# Patient Record
Sex: Female | Born: 1935 | Race: White | Hispanic: No | Marital: Married
Health system: Southern US, Community
[De-identification: ages and names within clinical notes are randomized; demographics above are authoritative.]

## PROBLEM LIST (undated history)

## (undated) DIAGNOSIS — E039 Hypothyroidism, unspecified: Secondary | ICD-10-CM

## (undated) DIAGNOSIS — I10 Essential (primary) hypertension: Secondary | ICD-10-CM

## (undated) DIAGNOSIS — I4891 Unspecified atrial fibrillation: Secondary | ICD-10-CM

---

## 2011-05-31 ENCOUNTER — Emergency Department: Payer: Self-pay | Admitting: Emergency Medicine

## 2011-07-16 ENCOUNTER — Emergency Department: Payer: Self-pay | Admitting: Emergency Medicine

## 2011-07-18 ENCOUNTER — Emergency Department: Payer: Self-pay | Admitting: Emergency Medicine

## 2011-07-19 ENCOUNTER — Emergency Department: Payer: Self-pay | Admitting: Unknown Physician Specialty

## 2011-07-27 ENCOUNTER — Ambulatory Visit: Payer: Self-pay | Admitting: Urology

## 2011-07-29 ENCOUNTER — Ambulatory Visit: Payer: Self-pay | Admitting: Urology

## 2011-08-12 ENCOUNTER — Ambulatory Visit: Payer: Self-pay | Admitting: Urology

## 2011-08-17 ENCOUNTER — Ambulatory Visit: Payer: Self-pay | Admitting: Urology

## 2011-09-16 ENCOUNTER — Ambulatory Visit: Payer: Self-pay | Admitting: Urology

## 2011-10-05 ENCOUNTER — Ambulatory Visit: Payer: Self-pay | Admitting: Obstetrics and Gynecology

## 2011-10-08 ENCOUNTER — Ambulatory Visit: Payer: Self-pay | Admitting: Obstetrics and Gynecology

## 2011-12-29 ENCOUNTER — Ambulatory Visit: Payer: Self-pay | Admitting: Urology

## 2012-06-28 ENCOUNTER — Ambulatory Visit: Payer: Self-pay | Admitting: Urology

## 2012-08-23 IMAGING — CR DG ABDOMEN 1V
1 series · 2 of 2 positions shown · non-contrast
Comparison: none

REASON FOR EXAM: Nephrolithiasis and renal colic
COMMENTS:

PROCEDURE:     DXR - DXR KIDNEY URETER BLADDER  - June 28, 2012 [DATE]
RESULT:     Comparisons:  12/29/2011

[Series 1: supine kub · 0.17mm/px · 2 of 2 slices shown]
[im 1/2]
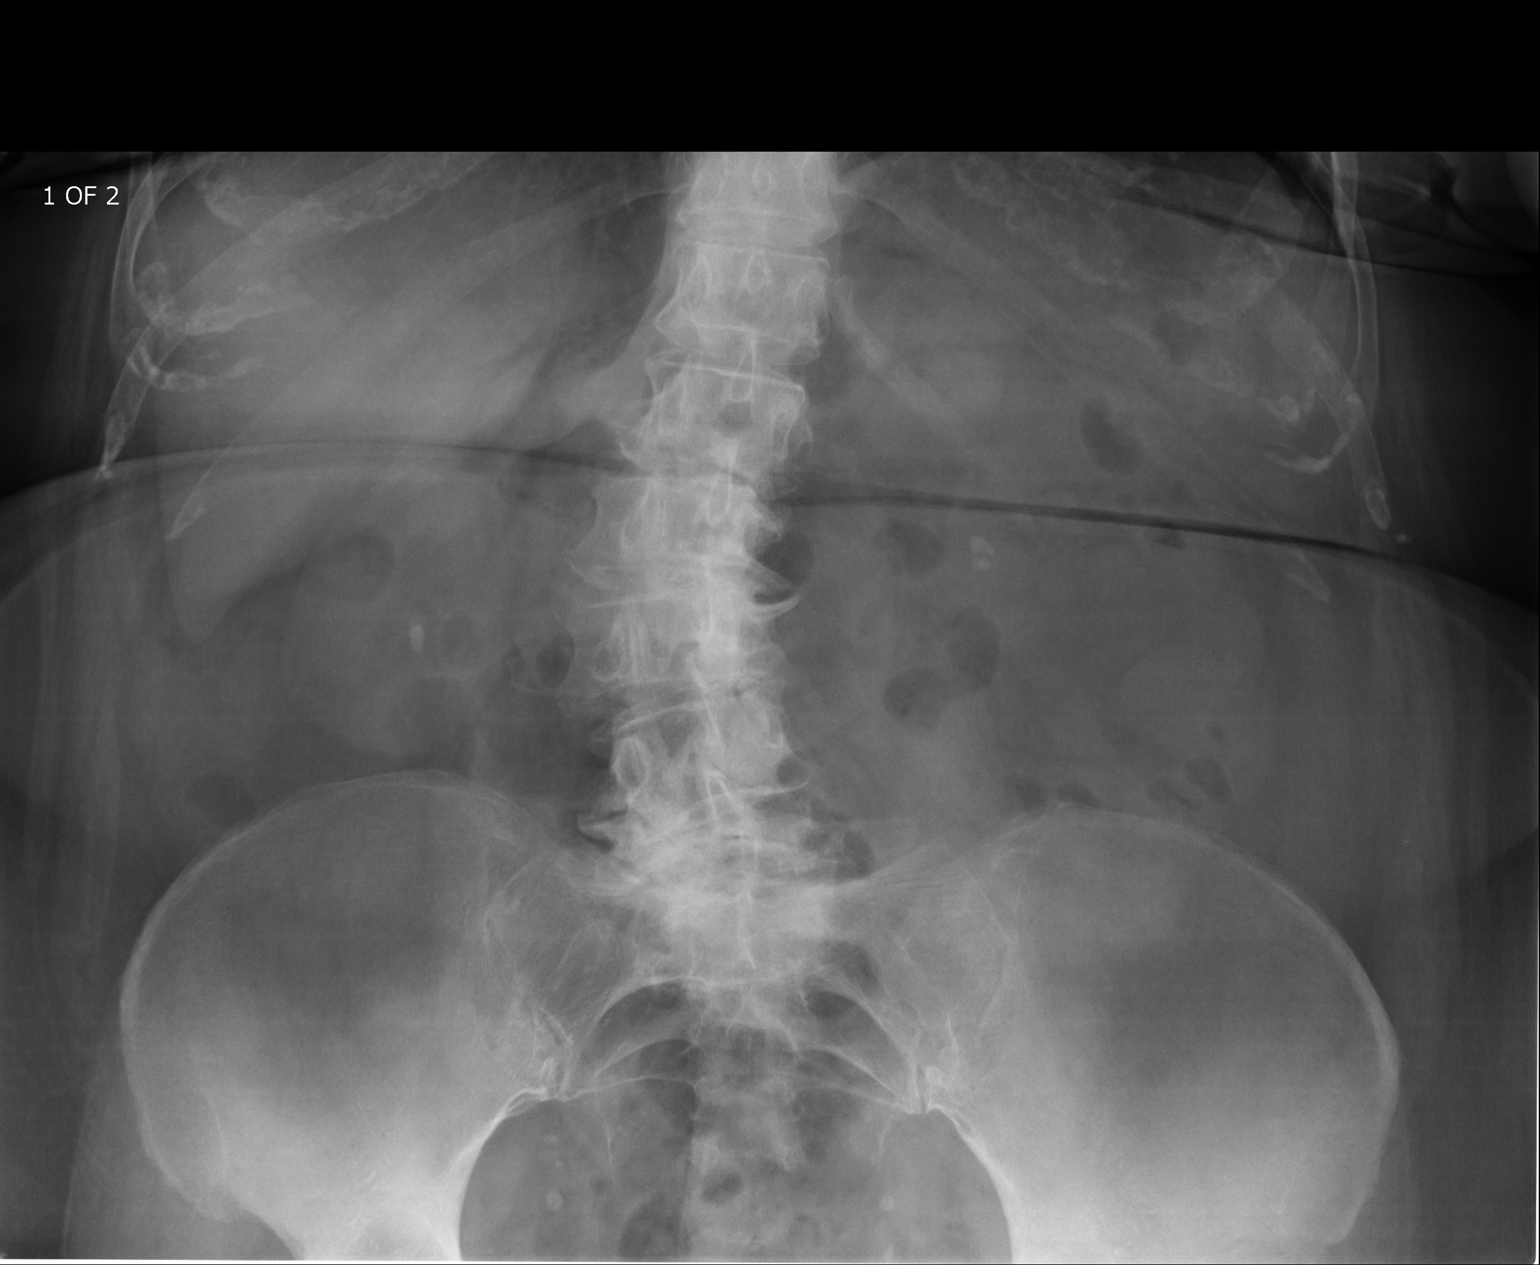
[im 2/2]
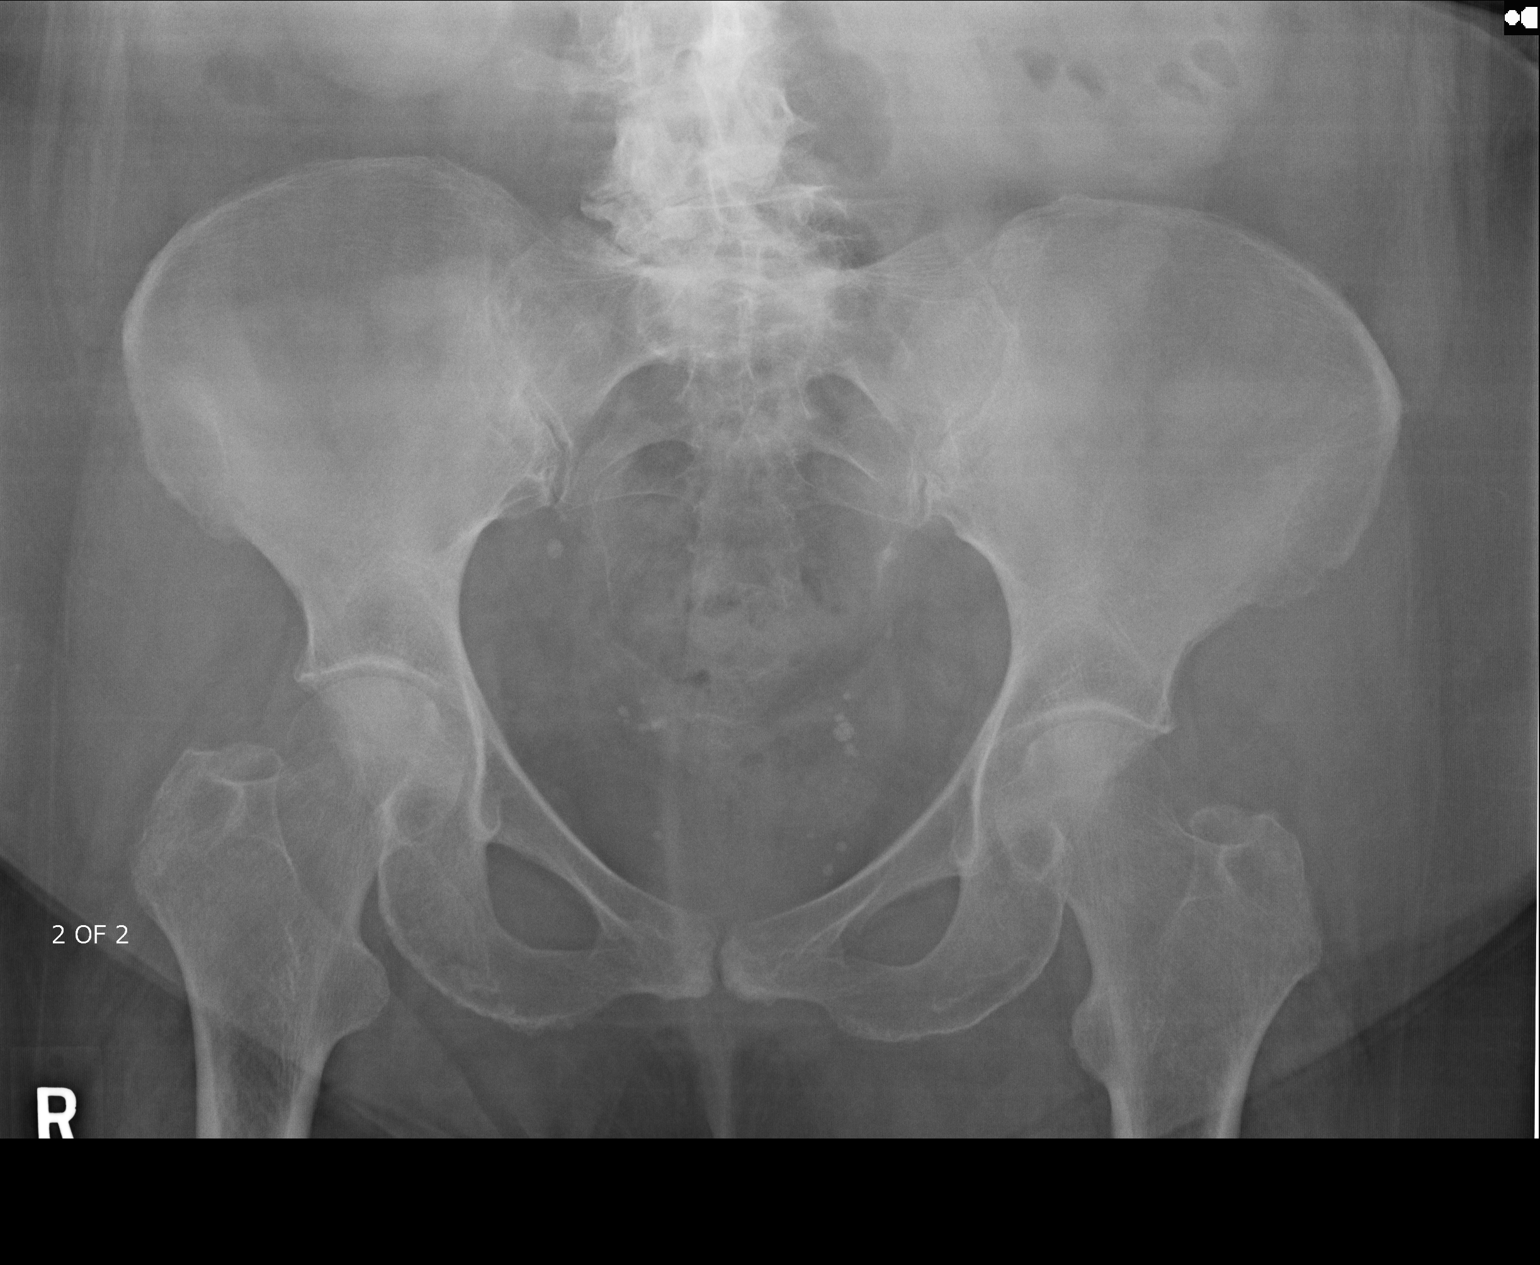

[2 of 2 positions shown; findings below may reference images not displayed]

FINDINGS: Supine radiograph of the abdomen is provided.

There is a nonspecific bowel gas pattern. There is no bowel dilatation to
suggest obstruction. There bilateral nephrolithiasis. There is no pathologic
calcification along the expected course of the ureters. There are numerous
phleboliths in the pelvis. There is no evidence of pneumoperitoneum, portal
venous gas, or pneumatosis.

The osseous structures are unremarkable.
IMPRESSION: Bilateral nephrolithiasis.

[REDACTED]

## 2013-07-25 ENCOUNTER — Emergency Department: Payer: Self-pay | Admitting: Emergency Medicine

## 2013-07-25 LAB — BASIC METABOLIC PANEL
Anion Gap: 2 — ABNORMAL LOW (ref 7–16)
BUN: 29 mg/dL — ABNORMAL HIGH (ref 7–18)
Calcium, Total: 10 mg/dL (ref 8.5–10.1)
Chloride: 106 mmol/L (ref 98–107)
Co2: 31 mmol/L (ref 21–32)
EGFR (African American): 45 — ABNORMAL LOW
EGFR (Non-African Amer.): 38 — ABNORMAL LOW
Glucose: 108 mg/dL — ABNORMAL HIGH (ref 65–99)
Osmolality: 284 (ref 275–301)
Potassium: 4.3 mmol/L (ref 3.5–5.1)

## 2013-07-25 LAB — CBC
HGB: 13.3 g/dL (ref 12.0–16.0)
MCH: 30 pg (ref 26.0–34.0)
MCV: 87 fL (ref 80–100)
RBC: 4.43 10*6/uL (ref 3.80–5.20)
RDW: 13.8 % (ref 11.5–14.5)
WBC: 8.7 10*3/uL (ref 3.6–11.0)

## 2013-07-26 LAB — URINALYSIS, COMPLETE
Bilirubin,UR: NEGATIVE
Glucose,UR: NEGATIVE mg/dL (ref 0–75)
Protein: 30
RBC,UR: 129 /HPF (ref 0–5)
Squamous Epithelial: 2

## 2015-04-19 ENCOUNTER — Emergency Department (HOSPITAL_COMMUNITY): Payer: Medicare Other

## 2015-04-19 ENCOUNTER — Encounter (HOSPITAL_COMMUNITY): Payer: Self-pay | Admitting: Emergency Medicine

## 2015-04-19 ENCOUNTER — Inpatient Hospital Stay (HOSPITAL_COMMUNITY)
Admission: EM | Admit: 2015-04-19 | Discharge: 2015-05-21 | DRG: 064 | Disposition: E | Payer: Medicare Other | Attending: Neurology | Admitting: Neurology

## 2015-04-19 ENCOUNTER — Inpatient Hospital Stay (HOSPITAL_COMMUNITY): Payer: Medicare Other

## 2015-04-19 DIAGNOSIS — E785 Hyperlipidemia, unspecified: Secondary | ICD-10-CM | POA: Diagnosis present

## 2015-04-19 DIAGNOSIS — I63412 Cerebral infarction due to embolism of left middle cerebral artery: Secondary | ICD-10-CM | POA: Diagnosis present

## 2015-04-19 DIAGNOSIS — I639 Cerebral infarction, unspecified: Secondary | ICD-10-CM | POA: Diagnosis not present

## 2015-04-19 DIAGNOSIS — E039 Hypothyroidism, unspecified: Secondary | ICD-10-CM | POA: Diagnosis present

## 2015-04-19 DIAGNOSIS — Z6836 Body mass index (BMI) 36.0-36.9, adult: Secondary | ICD-10-CM | POA: Diagnosis not present

## 2015-04-19 DIAGNOSIS — J690 Pneumonitis due to inhalation of food and vomit: Secondary | ICD-10-CM | POA: Diagnosis present

## 2015-04-19 DIAGNOSIS — R1314 Dysphagia, pharyngoesophageal phase: Secondary | ICD-10-CM | POA: Diagnosis present

## 2015-04-19 DIAGNOSIS — Z66 Do not resuscitate: Secondary | ICD-10-CM | POA: Diagnosis present

## 2015-04-19 DIAGNOSIS — R29810 Facial weakness: Secondary | ICD-10-CM | POA: Diagnosis present

## 2015-04-19 DIAGNOSIS — I482 Chronic atrial fibrillation, unspecified: Secondary | ICD-10-CM | POA: Diagnosis present

## 2015-04-19 DIAGNOSIS — R4182 Altered mental status, unspecified: Secondary | ICD-10-CM | POA: Diagnosis present

## 2015-04-19 DIAGNOSIS — R0600 Dyspnea, unspecified: Secondary | ICD-10-CM | POA: Insufficient documentation

## 2015-04-19 DIAGNOSIS — Z452 Encounter for adjustment and management of vascular access device: Secondary | ICD-10-CM

## 2015-04-19 DIAGNOSIS — I10 Essential (primary) hypertension: Secondary | ICD-10-CM | POA: Diagnosis present

## 2015-04-19 DIAGNOSIS — R2990 Unspecified symptoms and signs involving the nervous system: Secondary | ICD-10-CM

## 2015-04-19 DIAGNOSIS — E669 Obesity, unspecified: Secondary | ICD-10-CM | POA: Diagnosis present

## 2015-04-19 DIAGNOSIS — R4701 Aphasia: Secondary | ICD-10-CM | POA: Diagnosis present

## 2015-04-19 DIAGNOSIS — G8191 Hemiplegia, unspecified affecting right dominant side: Secondary | ICD-10-CM | POA: Diagnosis present

## 2015-04-19 DIAGNOSIS — G936 Cerebral edema: Secondary | ICD-10-CM | POA: Diagnosis present

## 2015-04-19 DIAGNOSIS — R4020 Unspecified coma: Secondary | ICD-10-CM | POA: Diagnosis present

## 2015-04-19 DIAGNOSIS — K117 Disturbances of salivary secretion: Secondary | ICD-10-CM | POA: Insufficient documentation

## 2015-04-19 DIAGNOSIS — Z7982 Long term (current) use of aspirin: Secondary | ICD-10-CM | POA: Diagnosis not present

## 2015-04-19 DIAGNOSIS — G935 Compression of brain: Secondary | ICD-10-CM | POA: Diagnosis present

## 2015-04-19 DIAGNOSIS — I63432 Cerebral infarction due to embolism of left posterior cerebral artery: Secondary | ICD-10-CM | POA: Diagnosis present

## 2015-04-19 DIAGNOSIS — Z515 Encounter for palliative care: Secondary | ICD-10-CM | POA: Diagnosis not present

## 2015-04-19 DIAGNOSIS — E038 Other specified hypothyroidism: Secondary | ICD-10-CM

## 2015-04-19 DIAGNOSIS — R609 Edema, unspecified: Secondary | ICD-10-CM

## 2015-04-19 HISTORY — DX: Unspecified atrial fibrillation: I48.91

## 2015-04-19 HISTORY — DX: Essential (primary) hypertension: I10

## 2015-04-19 HISTORY — DX: Hypothyroidism, unspecified: E03.9

## 2015-04-19 LAB — DIFFERENTIAL
BASOS ABS: 0 10*3/uL (ref 0.0–0.1)
BASOS PCT: 0 % (ref 0–1)
Eosinophils Absolute: 0 10*3/uL (ref 0.0–0.7)
Eosinophils Relative: 0 % (ref 0–5)
Lymphocytes Relative: 7 % — ABNORMAL LOW (ref 12–46)
Lymphs Abs: 0.9 10*3/uL (ref 0.7–4.0)
MONO ABS: 0.8 10*3/uL (ref 0.1–1.0)
MONOS PCT: 6 % (ref 3–12)
Neutro Abs: 11.5 10*3/uL — ABNORMAL HIGH (ref 1.7–7.7)
Neutrophils Relative %: 87 % — ABNORMAL HIGH (ref 43–77)

## 2015-04-19 LAB — I-STAT CHEM 8, ED
BUN: 36 mg/dL — ABNORMAL HIGH (ref 6–23)
CALCIUM ION: 1.2 mmol/L (ref 1.13–1.30)
Chloride: 102 mmol/L (ref 96–112)
Creatinine, Ser: 1.2 mg/dL — ABNORMAL HIGH (ref 0.50–1.10)
GLUCOSE: 153 mg/dL — AB (ref 70–99)
HEMATOCRIT: 46 % (ref 36.0–46.0)
HEMOGLOBIN: 15.6 g/dL — AB (ref 12.0–15.0)
POTASSIUM: 4.3 mmol/L (ref 3.5–5.1)
Sodium: 136 mmol/L (ref 135–145)
TCO2: 22 mmol/L (ref 0–100)

## 2015-04-19 LAB — COMPREHENSIVE METABOLIC PANEL
ALK PHOS: 74 U/L (ref 39–117)
ALT: 34 U/L (ref 0–35)
AST: 32 U/L (ref 0–37)
Albumin: 3.8 g/dL (ref 3.5–5.2)
Anion gap: 14 (ref 5–15)
BUN: 28 mg/dL — ABNORMAL HIGH (ref 6–23)
CO2: 21 mmol/L (ref 19–32)
Calcium: 9.6 mg/dL (ref 8.4–10.5)
Chloride: 100 mmol/L (ref 96–112)
Creatinine, Ser: 1.36 mg/dL — ABNORMAL HIGH (ref 0.50–1.10)
GFR calc Af Amer: 42 mL/min — ABNORMAL LOW (ref >90)
GFR calc non Af Amer: 36 mL/min — ABNORMAL LOW (ref >90)
Glucose, Bld: 152 mg/dL — ABNORMAL HIGH (ref 70–99)
Potassium: 4.1 mmol/L (ref 3.5–5.1)
SODIUM: 135 mmol/L (ref 135–145)
TOTAL PROTEIN: 7 g/dL (ref 6.0–8.3)
Total Bilirubin: 0.8 mg/dL (ref 0.3–1.2)

## 2015-04-19 LAB — MRSA PCR SCREENING: MRSA BY PCR: NEGATIVE

## 2015-04-19 LAB — RAPID URINE DRUG SCREEN, HOSP PERFORMED
AMPHETAMINES: NOT DETECTED
BENZODIAZEPINES: POSITIVE — AB
Barbiturates: NOT DETECTED
COCAINE: NOT DETECTED
OPIATES: NOT DETECTED
Tetrahydrocannabinol: NOT DETECTED

## 2015-04-19 LAB — I-STAT TROPONIN, ED: Troponin i, poc: 0.15 ng/mL (ref 0.00–0.08)

## 2015-04-19 LAB — URINALYSIS, ROUTINE W REFLEX MICROSCOPIC
Bilirubin Urine: NEGATIVE
Glucose, UA: NEGATIVE mg/dL
Hgb urine dipstick: NEGATIVE
KETONES UR: NEGATIVE mg/dL
Leukocytes, UA: NEGATIVE
Nitrite: NEGATIVE
PH: 5 (ref 5.0–8.0)
Protein, ur: NEGATIVE mg/dL
SPECIFIC GRAVITY, URINE: 1.02 (ref 1.005–1.030)
UROBILINOGEN UA: 0.2 mg/dL (ref 0.0–1.0)

## 2015-04-19 LAB — PROTIME-INR
INR: 1.05 (ref 0.00–1.49)
PROTHROMBIN TIME: 13.8 s (ref 11.6–15.2)

## 2015-04-19 LAB — CBC
HCT: 43.3 % (ref 36.0–46.0)
HEMOGLOBIN: 14.2 g/dL (ref 12.0–15.0)
MCH: 28.9 pg (ref 26.0–34.0)
MCHC: 32.8 g/dL (ref 30.0–36.0)
MCV: 88.2 fL (ref 78.0–100.0)
Platelets: 204 10*3/uL (ref 150–400)
RBC: 4.91 MIL/uL (ref 3.87–5.11)
RDW: 13.1 % (ref 11.5–15.5)
WBC: 13.2 10*3/uL — AB (ref 4.0–10.5)

## 2015-04-19 LAB — APTT: aPTT: 22 seconds — ABNORMAL LOW (ref 24–37)

## 2015-04-19 LAB — ETHANOL: Alcohol, Ethyl (B): <5 mg/dL (ref 0–9)

## 2015-04-19 MED ORDER — ASPIRIN 300 MG RE SUPP
300.0000 mg | Freq: Every day | RECTAL | Status: DC
  Administered 2015-04-19 – 2015-04-20 (×2): 300 mg via RECTAL
  Filled 2015-04-19 (×3): qty 1

## 2015-04-19 MED ORDER — ONDANSETRON HCL 4 MG/2ML IJ SOLN
4.0000 mg | Freq: Once | INTRAMUSCULAR | Status: AC
  Administered 2015-04-19: 4 mg via INTRAVENOUS
  Filled 2015-04-19: qty 2

## 2015-04-19 MED ORDER — LEVOTHYROXINE SODIUM 100 MCG IV SOLR
50.0000 ug | Freq: Every day | INTRAVENOUS | Status: DC
  Administered 2015-04-19 – 2015-04-20 (×2): 50 ug via INTRAVENOUS
  Filled 2015-04-19 (×3): qty 5

## 2015-04-19 MED ORDER — ONDANSETRON HCL 4 MG PO TABS: 4.0000 mg | ORAL_TABLET | Freq: Four times a day (QID) | ORAL | Status: DC | PRN

## 2015-04-19 MED ORDER — ACETAMINOPHEN 325 MG PO TABS: 650.0000 mg | ORAL_TABLET | Freq: Four times a day (QID) | ORAL | Status: DC | PRN

## 2015-04-19 MED ORDER — ONDANSETRON HCL 4 MG/2ML IJ SOLN: 4.0000 mg | Freq: Four times a day (QID) | INTRAMUSCULAR | Status: DC | PRN

## 2015-04-19 MED ORDER — SODIUM CHLORIDE 0.9 % IV SOLN: INTRAVENOUS | Status: AC

## 2015-04-19 MED ORDER — DEXTROSE 5 % IV SOLN
2.0000 g | INTRAVENOUS | Status: DC
  Administered 2015-04-19 – 2015-04-20 (×2): 2 g via INTRAVENOUS
  Filled 2015-04-19 (×4): qty 2

## 2015-04-19 MED ORDER — SODIUM CHLORIDE 0.9 % IV SOLN
INTRAVENOUS | Status: DC
  Administered 2015-04-19: 18:00:00 via INTRAVENOUS

## 2015-04-19 MED ORDER — MORPHINE SULFATE 2 MG/ML IJ SOLN
2.0000 mg | INTRAMUSCULAR | Status: DC | PRN
Start: 2015-04-19 — End: 2015-04-21
  Administered 2015-04-21: 2 mg via INTRAVENOUS
  Filled 2015-04-19: qty 1

## 2015-04-19 MED ORDER — HEPARIN SODIUM (PORCINE) 5000 UNIT/ML IJ SOLN
5000.0000 [IU] | Freq: Three times a day (TID) | INTRAMUSCULAR | Status: DC
  Administered 2015-04-19 – 2015-04-21 (×5): 5000 [IU] via SUBCUTANEOUS
  Filled 2015-04-19 (×8): qty 1

## 2015-04-19 MED ORDER — SODIUM CHLORIDE 0.9 % IJ SOLN
3.0000 mL | Freq: Two times a day (BID) | INTRAMUSCULAR | Status: DC
  Administered 2015-04-19 – 2015-04-20 (×2): 3 mL via INTRAVENOUS

## 2015-04-19 MED ORDER — ACETAMINOPHEN 650 MG RE SUPP: 650.0000 mg | Freq: Four times a day (QID) | RECTAL | Status: DC | PRN

## 2015-04-19 MED ORDER — METOPROLOL TARTRATE 1 MG/ML IV SOLN
5.0000 mg | Freq: Four times a day (QID) | INTRAVENOUS | Status: DC
  Administered 2015-04-19 – 2015-04-20 (×4): 5 mg via INTRAVENOUS
  Filled 2015-04-19 (×7): qty 5

## 2015-04-19 NOTE — ED Notes (Signed)
Pt family (son and nephew) at bedside, states pt has been to see pcp multiple times for "not feeling well and bp issues." states that her bp medications have been changed multiple times and was recently dx with "virus". Nephew reports that when he found pt this am that she was "slumped over on the toilet with her head against the wall." Pt family states pt normally able to use walker and is alert and oriented. Also reports pt has hx of afib and sees pcp in [place].

## 2015-04-19 NOTE — Progress Notes (Signed)
Pt arrived to unit accompanied by ED RN. Attempted to orient pt to room/unit, pt is not interactive. Family at bedside. No s/s of acute distress noted.

## 2015-04-19 NOTE — ED Provider Notes (Signed)
CSN: [id number]     Arrival date & time 04/15/2015  1316 History   First MD Initiated Contact with Patient 03/23/2015 1325     Chief Complaint  Patient presents with  . Weakness  . Altered Mental Status    Low 5 caveat due to unresponsiveness. (Consider location/radiation/quality/duration/timing/severity/associated sxs/prior Treatment) Patient is a 81 y.o. female presenting with weakness and altered mental status. The history is provided by the patient.  Weakness This is a new problem.  Altered Mental Status Associated symptoms: weakness    brought in by EMS. Found to be unresponsive on the toilet. Family states was normal last night. Patient is unresponsive and cannot provide history.  Past Medical History  Diagnosis Date  . Hypertension   . Hypothyroid   . Atrial fibrillation    History reviewed. No pertinent past surgical history. No family history on file. History  Substance Use Topics  . Smoking status: Not on file  . Smokeless tobacco: Not on file  . Alcohol Use: No   OB History    No data available     Review of Systems  Unable to perform ROS Neurological: Positive for weakness.      Allergies  Penicillins  Home Medications   Prior to Admission medications   Medication Sig Start Date End Date Taking? Authorizing Provider  aspirin EC 81 MG tablet Take 81 mg by mouth daily.   Yes Historical Provider, MD  cholecalciferol (VITAMIN D) 1000 UNITS tablet Take 1,000 Units by mouth daily.   Yes Historical Provider, MD  hydrochlorothiazide (MICROZIDE) 12.5 MG capsule Take 12.5 mg by mouth daily.   Yes Historical Provider, MD  levothyroxine (SYNTHROID, LEVOTHROID) 100 MCG tablet Take 100 mcg by mouth daily before breakfast.   Yes Historical Provider, MD  LORazepam (ATIVAN) 1 MG tablet Take 1 mg by mouth daily.   Yes Historical Provider, MD  metoprolol succinate (TOPROL-XL) 25 MG 24 hr tablet Take 25 mg by mouth daily.   Yes Historical Provider, MD  quinapril (ACCUPRIL)  40 MG tablet Take 40 mg by mouth at bedtime.   Yes Historical Provider, MD   BP 148/64 mmHg  Pulse 72  Temp(Src) 97.2 F (36.2 C) (Rectal)  Resp 19  Ht  (1.676 m)  Wt 215 lb (97.523 kg)  BMI 34.72 kg/m2  SpO2 93% Physical Exam  Constitutional: She appears well-nourished.  Eyes:  Pupils are somewhat sluggish  Neck: Neck supple.  Cardiovascular: Normal rate.   Pulmonary/Chest:  Transmitted upper airway sounds  Abdominal: She exhibits no distension.  Neurological:  Patient is nonverbal. Toes up chloride right and downward pulling on left. Will move left arm some will not look to voice. Does withdrawal some from pain. Difficulty moving right upper extremity. Will not follow commands gag reflex is intact but mouth is clenched. Complete NIH scoring done by neurology.  Skin: Skin is warm.  Vitals reviewed.   ED Course  Procedures (including critical care time) Labs Review Labs Reviewed  APTT - Abnormal; Notable for the following:    aPTT 22 (*)    All other components within normal limits  CBC - Abnormal; Notable for the following:    WBC 13.2 (*)    All other components within normal limits  DIFFERENTIAL - Abnormal; Notable for the following:    Neutrophils Relative % 87 (*)    Neutro Abs 11.5 (*)    Lymphocytes Relative 7 (*)    All other components within normal limits  COMPREHENSIVE METABOLIC PANEL -  Abnormal; Notable for the following:    Glucose, Bld 152 (*)    BUN 28 (*)    Creatinine, Ser 1.36 (*)    GFR calc non Af Amer 36 (*)    GFR calc Af Amer 42 (*)    All other components within normal limits  URINE RAPID DRUG SCREEN (HOSP PERFORMED) - Abnormal; Notable for the following:    Benzodiazepines POSITIVE (*)    All other components within normal limits  I-STAT CHEM 8, ED - Abnormal; Notable for the following:    BUN 36 (*)    Creatinine, Ser 1.20 (*)    Glucose, Bld 153 (*)    Hemoglobin 15.6 (*)    All other components within normal limits  I-STAT  TROPOININ, ED - Abnormal; Notable for the following:    Troponin i, poc 0.15 (*)    All other components within normal limits  ETHANOL  PROTIME-INR  URINALYSIS, ROUTINE W REFLEX MICROSCOPIC    Imaging Review Ct Head Wo Contrast  November 14, 2015   CLINICAL DATA:  Extensive right-sided  paresis/weakness  EXAM: CT HEAD WITHOUT CONTRAST  TECHNIQUE: Contiguous axial images were obtained from the base of the skull through the vertex without intravenous contrast.  COMPARISON:  None.  FINDINGS: The ventricles are normal in size and configuration. There is extensive cytotoxic edema throughout the left temporal and occipital lobes. There is also decreased attenuation in the superior left cerebellum compared to the right. There is decreased attenuation throughout the left posterior lentiform nucleus as well as involving the posterior limb of the left external capsule. Left thalamus as well as the anterior left basal ganglia regions do not appear involved. There is sulcal effacement in these areas of cytotoxic edema. There is no hemorrhage, well-defined mass, or midline shift. There is no subdural or epidural fluid collection.  The bony calvarium appears intact. The mastoid air cells are clear. There is mucosal thickening in both maxillary antra with retention cysts in both maxillary antra, more on the right than on the left.  IMPRESSION: Extensive evidence of acute infarct involving essentially all of the left occipital and temporal lobes as well as the posterior left lentiform nucleus and portions of the superior left cerebellum. This finding is consistent with an acute infarct involving most if not all of the left posterior cerebral artery distribution. No hemorrhage or well-defined mass. No midline shift.  These results were called by telephone at the time of interpretation on November 14, 2015 at 3:03 pm to Dr. Benjiman CoreNATHAN Marilin Kofman , who verbally acknowledged these results.   Electronically Signed   By: Bretta BangWilliam  Woodruff III M.D.    On: 0November 25, 2016 15:03   Dg Chest Portable 1 View  November 14, 2015   CLINICAL DATA:  Weakness and hypertension  EXAM: PORTABLE CHEST - 1 VIEW  COMPARISON:  None.  FINDINGS: There is no edema or consolidation. Heart is borderline enlarged with pulmonary vascularity within normal limits. No adenopathy. There is thoracic levoscoliosis.  IMPRESSION: No edema or consolidation.  Heart borderline prominent.   Electronically Signed   By: Bretta BangWilliam  Woodruff III M.D.   On: 0November 25, 2016 14:44     EKG Interpretation   Date/Time:  Saturday April 19 2015 13:55:36 EDT Ventricular Rate:  75 PR Interval:    QRS Duration: 80 QT Interval:  371 QTC Calculation: 414 R Axis:   15 Text Interpretation:  Sinus rhythm Low voltage, precordial leads Abnormal  R-wave progression, early transition Confirmed by Rubin PayorPICKERING  MD, Argus Caraher  [phone]([id number]) on November 14, 2015 4:40:52 PM  MDM   Final diagnoses:  Stroke    Patient came in unresponsive with a large. Has an overall poor prognosis. Has been seen by neurology. Patient is now a DNR/DNI. Will admit to stepdown unit.     Benjiman Core, MD 03/24/2015 [phone]

## 2015-04-19 NOTE — ED Notes (Signed)
Emesis X1. Noted to be coffee ground in color. Possible aspiration.

## 2015-04-19 NOTE — Consult Note (Signed)
Admission H&P    Chief Complaint: New onset aphasia and right hemiplegia.  HPI: Carol Baker is an 81 y.o. female history of hypertension, hypothyroidism and recently diagnosed atrial fibrillation presenting with acute aphasia and right hemiplegia. She was last seen well at 10:00 last night. She has no previous history of stroke nor TIA. His been taking aspirin daily. CT scan of her head showed large PCA territory cerebral infarction. NIH stroke score NIH stroke score was 24. Patient became nauseated and vomited a large amount of appearing material. Aspiration was also suspected.  LSN: 10 PM on 04/18/2015 tPA Given: No: Beyond time under for treatment consideration mRankin:  Past Medical History  Diagnosis Date  . Hypertension   . Hypothyroid   . Atrial fibrillation     History reviewed. No pertinent past surgical history.   Family history: Unobtainable as patient is aphasic  Social History:  reports that she does not drink alcohol. Her tobacco and drug histories are not on file.  Allergies:  Allergies  Allergen Reactions  . Penicillins Swelling    Medications: Patient's medications prior to admission were reviewed by me.  ROS: Unobtainable as patient is noncommunicative.  Physical Examination: Blood pressure 153/104, pulse 77, temperature 97.2 F (36.2 C), temperature source Rectal, resp. rate 18, height _0  (1.676 m), weight 97.523 kg (215 lb), SpO2 99 %.  HEENT-  Normocephalic, no lesions, without obvious abnormality.  Normal external eye and conjunctiva.  Normal TM's bilaterally.  Normal auditory canals and external ears. Normal external nose, mucus membranes and septum.  Normal pharynx. Neck supple with no masses, nodes, nodules or enlargement. Cardiovascular - irregularly irregular rhythm and S1, S2 normal Lungs - chest clear, no wheezing, rales, normal symmetric air entry Abdomen - soft, non-tender; bowel sounds normal; no masses,  no organomegaly Extremities -  no joint deformities, effusion, or inflammation Skin -   Neurologic Examination: Mental Status: Patient was lethargic and globally aphasic. She had gaze deviation to the left side with inattentiveness to the right. Cranial Nerves: II-dense right homonymous hemianopsia. III/IV/VI-Pupils were equal and reacted. Eyes were deviated to the left side conjugately with no movement beyond midline toward the right.    VII-moderate right lower facial weakness. VIII-normal. X-no speech output. Motor: Right hemiplegia with moderately increased muscle tone in the arm and leg; normal strength and tone of left extremities. Sensory: Unable to adequately assess. Deep Tendon Reflexes: 2+ and left extremities: Unable to elicit reflexes in right extremities due to increased muscle tone. Plantars: Extensor on the right and mute on the left  Results for orders placed or performed during the hospital encounter of 03/29/2015 (from the past 48 hour(s))  Urine Drug Screen     Status: Abnormal   Collection Time: 04/10/2015  1:30 PM  Result Value Ref Range   Opiates NONE DETECTED NONE DETECTED   Cocaine NONE DETECTED NONE DETECTED   Benzodiazepines POSITIVE (A) NONE DETECTED   Amphetamines NONE DETECTED NONE DETECTED   Tetrahydrocannabinol NONE DETECTED NONE DETECTED   Barbiturates NONE DETECTED NONE DETECTED    Comment:        DRUG SCREEN FOR MEDICAL PURPOSES ONLY.  IF CONFIRMATION IS NEEDED FOR ANY PURPOSE, NOTIFY LAB WITHIN 5 DAYS.        LOWEST DETECTABLE LIMITS FOR URINE DRUG SCREEN Drug Class       Cutoff (ng/mL) Amphetamine      1000 Barbiturate      200 Benzodiazepine   295 Tricyclics  300 Opiates          300 Cocaine          300 THC              50   Urinalysis, Routine w reflex microscopic     Status: None   Collection Time: 04/18/2015  1:30 PM  Result Value Ref Range   Color, Urine YELLOW YELLOW   APPearance CLEAR CLEAR   Specific Gravity, Urine 1.020 1.005 - 1.030   pH 5.0 5.0 - 8.0    Glucose, UA NEGATIVE NEGATIVE mg/dL   Hgb urine dipstick NEGATIVE NEGATIVE   Bilirubin Urine NEGATIVE NEGATIVE   Ketones, ur NEGATIVE NEGATIVE mg/dL   Protein, ur NEGATIVE NEGATIVE mg/dL   Urobilinogen, UA 0.2 0.0 - 1.0 mg/dL   Nitrite NEGATIVE NEGATIVE   Leukocytes, UA NEGATIVE NEGATIVE    Comment: MICROSCOPIC NOT DONE ON URINES WITH NEGATIVE PROTEIN, BLOOD, LEUKOCYTES, NITRITE, OR GLUCOSE <1000 mg/dL.  Protime-INR     Status: None   Collection Time: 03/29/2015  2:10 PM  Result Value Ref Range   Prothrombin Time 13.8 11.6 - 15.2 seconds   INR 1.05 0.00 - 1.49  APTT     Status: Abnormal   Collection Time: 03/30/2015  2:10 PM  Result Value Ref Range   aPTT 22 (L) 24 - 37 seconds  CBC     Status: Abnormal   Collection Time: 04/06/2015  2:10 PM  Result Value Ref Range   WBC 13.2 (H) 4.0 - 10.5 K/uL   RBC 4.91 3.87 - 5.11 MIL/uL   Hemoglobin 14.2 12.0 - 15.0 g/dL   HCT 43.3 36.0 - 46.0 %   MCV 88.2 78.0 - 100.0 fL   MCH 28.9 26.0 - 34.0 pg   MCHC 32.8 30.0 - 36.0 g/dL   RDW 13.1 11.5 - 15.5 %   Platelets 204 150 - 400 K/uL  Differential     Status: Abnormal   Collection Time: 03/21/2015  2:10 PM  Result Value Ref Range   Neutrophils Relative % 87 (H) 43 - 77 %   Neutro Abs 11.5 (H) 1.7 - 7.7 K/uL   Lymphocytes Relative 7 (L) 12 - 46 %   Lymphs Abs 0.9 0.7 - 4.0 K/uL   Monocytes Relative 6 3 - 12 %   Monocytes Absolute 0.8 0.1 - 1.0 K/uL   Eosinophils Relative 0 0 - 5 %   Eosinophils Absolute 0.0 0.0 - 0.7 K/uL   Basophils Relative 0 0 - 1 %   Basophils Absolute 0.0 0.0 - 0.1 K/uL  Comprehensive metabolic panel     Status: Abnormal   Collection Time: 03/23/2015  2:10 PM  Result Value Ref Range   Sodium 135 135 - 145 mmol/L   Potassium 4.1 3.5 - 5.1 mmol/L   Chloride 100 96 - 112 mmol/L   CO2 21 19 - 32 mmol/L   Glucose, Bld 152 (H) 70 - 99 mg/dL   BUN 28 (H) 6 - 23 mg/dL   Creatinine, Ser 1.36 (H) 0.50 - 1.10 mg/dL   Calcium 9.6 8.4 - 10.5 mg/dL   Total Protein 7.0 6.0 - 8.3  g/dL   Albumin 3.8 3.5 - 5.2 g/dL   AST 32 0 - 37 U/L   ALT 34 0 - 35 U/L   Alkaline Phosphatase 74 39 - 117 U/L   Total Bilirubin 0.8 0.3 - 1.2 mg/dL   GFR calc non Af Amer 36 (L) >90 mL/min   GFR calc  Af Amer 42 (L) >90 mL/min    Comment: (NOTE) The eGFR has been calculated using the CKD EPI equation. This calculation has not been validated in all clinical situations. eGFR's persistently <90 mL/min signify possible Chronic Kidney Disease.    Anion gap 14 5 - 15  I-Stat Troponin, ED (not at [facility])     Status: Abnormal   Collection Time: 03/30/2015  2:20 PM  Result Value Ref Range   Troponin i, poc 0.15 (HH) 0.00 - 0.08 ng/mL   Comment NOTIFIED PHYSICIAN    Comment 3            Comment: Due to the release kinetics of cTnI, a negative result within the first hours of the onset of symptoms does not rule out myocardial infarction with certainty. If myocardial infarction is still suspected, repeat the test at appropriate intervals.   I-Stat Chem 8, ED     Status: Abnormal   Collection Time: 04/18/2015  2:22 PM  Result Value Ref Range   Sodium 136 135 - 145 mmol/L   Potassium 4.3 3.5 - 5.1 mmol/L   Chloride 102 96 - 112 mmol/L   BUN 36 (H) 6 - 23 mg/dL   Creatinine, Ser 1.20 (H) 0.50 - 1.10 mg/dL   Glucose, Bld 153 (H) 70 - 99 mg/dL   Calcium, Ion 1.20 1.13 - 1.30 mmol/L   TCO2 22 0 - 100 mmol/L   Hemoglobin 15.6 (H) 12.0 - 15.0 g/dL   HCT 46.0 36.0 - 46.0 %   Ct Head Wo Contrast  03/23/2015   CLINICAL DATA:  Extensive right-sided  paresis/weakness  EXAM: CT HEAD WITHOUT CONTRAST  TECHNIQUE: Contiguous axial images were obtained from the base of the skull through the vertex without intravenous contrast.  COMPARISON:  None.  FINDINGS: The ventricles are normal in size and configuration. There is extensive cytotoxic edema throughout the left temporal and occipital lobes. There is also decreased attenuation in the superior left cerebellum compared to the right. There is decreased  attenuation throughout the left posterior lentiform nucleus as well as involving the posterior limb of the left external capsule. Left thalamus as well as the anterior left basal ganglia regions do not appear involved. There is sulcal effacement in these areas of cytotoxic edema. There is no hemorrhage, well-defined mass, or midline shift. There is no subdural or epidural fluid collection.  The bony calvarium appears intact. The mastoid air cells are clear. There is mucosal thickening in both maxillary antra with retention cysts in both maxillary antra, more on the right than on the left.  IMPRESSION: Extensive evidence of acute infarct involving essentially all of the left occipital and temporal lobes as well as the posterior left lentiform nucleus and portions of the superior left cerebellum. This finding is consistent with an acute infarct involving most if not all of the left posterior cerebral artery distribution. No hemorrhage or well-defined mass. No midline shift.  These results were called by telephone at the time of interpretation on 04/11/2015 at 3:03 pm to Dr. Davonna Belling , who verbally acknowledged these results.   Electronically Signed   By: Lowella Grip III M.D.   On: 04/02/2015 15:03   Dg Chest Portable 1 View  03/28/2015   CLINICAL DATA:  Weakness and hypertension  EXAM: PORTABLE CHEST - 1 VIEW  COMPARISON:  None.  FINDINGS: There is no edema or consolidation. Heart is borderline enlarged with pulmonary vascularity within normal limits. No adenopathy. There is thoracic levoscoliosis.  IMPRESSION: No  edema or consolidation.  Heart borderline prominent.   Electronically Signed   By: Lowella Grip III M.D.   On: 04/11/2015 14:44    Assessment: 81 y.o. female with hypertension and recently diagnosed atrial fibrillation not on anticoagulation, presenting with large left PCA territory ischemic infarction, most likely of embolic source. There is also concern for possible aspiration  pneumonia.  Stroke Risk Factors - atrial fibrillation and hypertension  Plan: 1. HgbA1c, fasting lipid panel 2. MRI, MRA  of the brain without contrast 3. PT consult, OT consult, Speech consult 4. Echocardiogram 5. Carotid dopplers 6. Prophylactic therapy- 7. Risk factor modification 8. Telemetry monitoring  C.R. Nicole Kindred, MD [facility] [phone]  04/01/2015, 3:49 PM

## 2015-04-19 NOTE — Progress Notes (Signed)
Pt has reddened blanchable areas on bilateral buttocks, present on admission.

## 2015-04-19 NOTE — ED Notes (Signed)
Dr. Rubin PayorPickering was notified of the patients 0.15 I-Stat Troponin.

## 2015-04-19 NOTE — ED Notes (Signed)
Pt presents to ED via EMS with R side deficits/weakness. Pt was LSN at 2200 yesterday. Grandson came to home this morning and found her on toilet unable to speak or move. Pt family states that she normally has no deficits, is able to talk/walk/etc. EMS stated that she has R facial droop and R sided deficits but did try to squeeze fingers en route. FSBS noted to be 144 en route.

## 2015-04-19 NOTE — H&P (Signed)
[facility] History and Physical  Carol Baker WUJ:[id number] DOB: [birth year] DOA: 2015/05/06  Referring physician: Emergency Department - Dr. Rubin Payor PCP: No primary care provider on file.  Specialists:   Chief Complaint: Lethargy, weakness  HPI: Carol Baker is a 81 y.o. female  With a hx of htn, hypothyroidism, afib on ASA who presents to the ED with right sided weakness and aphasia. In the ED, the patient was noted to have CT evidence of CVA involving the majority of the L occipital and temporal lobes as well as pos L lentiform nucleus and superior L cerebellum. Neurology was consulted and hospitalist was consulted for admission. Of note, there were concerns of emesis with possible aspiration noted in the ED.  Review of Systems:  Unable to obtain given pt's CVA  Past Medical History  Diagnosis Date  . Hypertension   . Hypothyroid   . Atrial fibrillation    History reviewed. No pertinent past surgical history. Social History:  reports that she does not drink alcohol. Her tobacco and drug histories are not on file.  where does patient live--home, ALF, SNF? and with whom if at home?  Can patient participate in ADLs?  Allergies  Allergen Reactions  . Penicillins Swelling    No family history on file. cannot obtain from patient given her CVA (be sure to complete)  Prior to Admission medications   Medication Sig Start Date End Date Taking? Authorizing Provider  aspirin EC 81 MG tablet Take 81 mg by mouth daily.   Yes Historical Provider, MD  cholecalciferol (VITAMIN D) 1000 UNITS tablet Take 1,000 Units by mouth daily.   Yes Historical Provider, MD  hydrochlorothiazide (MICROZIDE) 12.5 MG capsule Take 12.5 mg by mouth daily.   Yes Historical Provider, MD  levothyroxine (SYNTHROID, LEVOTHROID) 100 MCG tablet Take 100 mcg by mouth daily before breakfast.   Yes Historical Provider, MD  LORazepam (ATIVAN) 1 MG tablet Take 1 mg by mouth daily.   Yes Historical Provider,  MD  metoprolol succinate (TOPROL-XL) 25 MG 24 hr tablet Take 25 mg by mouth daily.   Yes Historical Provider, MD  quinapril (ACCUPRIL) 40 MG tablet Take 40 mg by mouth at bedtime.   Yes Historical Provider, MD   Physical Exam: Filed Vitals:   05-06-2015 1354 05-06-15 1415 2015-05-06 1500 2015-05-06 1515  BP:  140/73 152/72 153/104  Pulse:  73 75 77  Temp: 97.2 F (36.2 C)     TempSrc: Rectal     Resp:  Height:      Weight:      SpO2:  98% 99% 99%     General:  Awake, aphasic, in nad  Eyes: PERRL B  ENT: membranes moist, dentition fair  Neck: trachea midline, neck supple  Cardiovascular: regular, s1, s2  Respiratory: normal resp effort, no wheezing  Abdomen: soft, obese, nondistended  Skin: normal skin turgor, no abnormal skin lesions seen  Musculoskeletal: perfused, no clubbing  Psychiatric: unable to assess given pt's neuro status from CVA  Neurologic: aphasic, 1-2/5 strength on L, 1/5 strength over R side  Labs on Admission:  Basic Metabolic Panel:  Recent Labs Lab 05-06-2015 1410 2015-05-06 1422  NA 135 136  K 4.1 4.3  CL 100 102  CO2 21  --   GLUCOSE 152* 153*  BUN 28* 36*  CREATININE 1.36* 1.20*  CALCIUM 9.6  --    Liver Function Tests:  Recent Labs Lab 2015/05/06 1410  AST 32  ALT 34  ALKPHOS 74  BILITOT 0.8  PROT 7.0  ALBUMIN 3.8   No results for input(s): LIPASE, AMYLASE in the last 168 hours. No results for input(s): AMMONIA in the last 168 hours. CBC:  Recent Labs Lab 03/28/2015 1410 03/21/2015 1422  WBC 13.2*  --   NEUTROABS 11.5*  --   HGB 14.2 15.6*  HCT 43.3 46.0  MCV 88.2  --   PLT 204  --    Cardiac Enzymes: No results for input(s): CKTOTAL, CKMB, CKMBINDEX, TROPONINI in the last 168 hours.  BNP (last 3 results) No results for input(s): BNP in the last 8760 hours.  ProBNP (last 3 results) No results for input(s): PROBNP in the last 8760 hours.  CBG: No results for input(s): GLUCAP in the last 168  hours.  Radiological Exams on Admission: Ct Head Wo Contrast  04/15/2015   CLINICAL DATA:  Extensive right-sided  paresis/weakness  EXAM: CT HEAD WITHOUT CONTRAST  TECHNIQUE: Contiguous axial images were obtained from the base of the skull through the vertex without intravenous contrast.  COMPARISON:  None.  FINDINGS: The ventricles are normal in size and configuration. There is extensive cytotoxic edema throughout the left temporal and occipital lobes. There is also decreased attenuation in the superior left cerebellum compared to the right. There is decreased attenuation throughout the left posterior lentiform nucleus as well as involving the posterior limb of the left external capsule. Left thalamus as well as the anterior left basal ganglia regions do not appear involved. There is sulcal effacement in these areas of cytotoxic edema. There is no hemorrhage, well-defined mass, or midline shift. There is no subdural or epidural fluid collection.  The bony calvarium appears intact. The mastoid air cells are clear. There is mucosal thickening in both maxillary antra with retention cysts in both maxillary antra, more on the right than on the left.  IMPRESSION: Extensive evidence of acute infarct involving essentially all of the left occipital and temporal lobes as well as the posterior left lentiform nucleus and portions of the superior left cerebellum. This finding is consistent with an acute infarct involving most if not all of the left posterior cerebral artery distribution. No hemorrhage or well-defined mass. No midline shift.  These results were called by telephone at the time of interpretation on 04/11/2015 at 3:03 pm to Dr. Benjiman Core , who verbally acknowledged these results.   Electronically Signed   By: Bretta Bang III M.D.   On: 04/17/2015 15:03   Dg Chest Portable 1 View  03/28/2015   CLINICAL DATA:  Weakness and hypertension  EXAM: PORTABLE CHEST - 1 VIEW  COMPARISON:  None.  FINDINGS:  There is no edema or consolidation. Heart is borderline enlarged with pulmonary vascularity within normal limits. No adenopathy. There is thoracic levoscoliosis.  IMPRESSION: No edema or consolidation.  Heart borderline prominent.   Electronically Signed   By: Bretta Bang III M.D.   On: 03/27/2015 14:44    Assessment/Plan Active Problems:   Stroke   HTN (hypertension)   Chronic a-fib   Hypothyroidism   1. Acute CVA 1. Extensive L sided acute CVA noted on CT head 2. Neurology is consulted 3. Will obtain carotid dopplers and 2d echo 4. MRI brain 5. ASA suppository ordered 6. Keep NPO 7. Consult SLP, PT/OT 8. Likely poor prognosis. Would monitor patient very closely and if no significant improvement or if patient declines, would involve Palliative Care at that time 2. Possible Aspiration Pneumonia 1. Emesis and suspected aspiration witnessed in  ED 2. Will cont pt on empiric zosyn 3. HTN 1. BP presently stable 2. Will hold ACEI to allow for permissive HTN 3. Continue IV beta blocker per below 4. Chronic afib 1. Presently rate controlled 2. Will continue on 5mg  IV lopressor to ensure rate control 5. Hypothyroid 1. Will continue on 50mcg IV daily 6. DVT prophylaxis 1. Heparin subQ  Code Status: DNR/DNI, confirmed with son at bedside Family Communication: Pt in room, pt's son at bedside Disposition Plan: Admit to stepdown  Jerald KiefCHIU, STEPHEN K [facility] Pager [phone]  If 7PM-7AM, please contact night-coverage [email or web address] Password [email or web address] 12-22-2014, 4:02 PM

## 2015-04-19 NOTE — Progress Notes (Signed)
ANTIBIOTIC CONSULT NOTE - INITIAL  Pharmacy Consult for cefepime Indication: aspiration pneumonia  Allergies  Allergen Reactions  . Penicillins Swelling    Patient Measurements: Height: 5\' 4"  (162.6 cm) Weight: 212 lb 11.9 oz (96.5 kg) IBW/kg (Calculated) : 54.7  Vital Signs: Temp: 98.6 F (37 C) (04/30 1828) Temp Source: Oral (04/30 1828) BP: 156/83 mmHg (04/30 1831) Pulse Rate: 96 (04/30 1831) Intake/Output from previous day:   Intake/Output from this shift:    Labs:  Recent Labs  10/06/2015 1410 10/06/2015 1422  WBC 13.2*  --   HGB 14.2 15.6*  PLT 204  --   CREATININE 1.36* 1.20*   Estimated Creatinine Clearance: 42.8 mL/min (by C-G formula based on Cr of 1.2). No results for input(s): VANCOTROUGH, VANCOPEAK, VANCORANDOM, GENTTROUGH, GENTPEAK, GENTRANDOM, TOBRATROUGH, TOBRAPEAK, TOBRARND, AMIKACINPEAK, AMIKACINTROU, AMIKACIN in the last 72 hours.   Microbiology: No results found for this or any previous visit (from the past 720 hour(s)).  Medical History: Past Medical History  Diagnosis Date  . Hypertension   . Hypothyroid   . Atrial fibrillation     Assessment: Carol Baker yof admitted 4/30 with acute CVA. Pharmacy consulted to start cefepime for aspiration pna (d/c'd initial zosyn order due to pcn swelling allergy and no abx history on file). No cultures ordered. Afeb, wbc 13.2. SCr 1.2, CrCl~43.  Goal of Therapy:  Eradication of infection  Plan:  Cefepime 2g IV q24h F/u clinical progress, renal function, abx plan  Babs BertinHaley Florita Nitsch, PharmD Clinical Pharmacist - Resident Pager [phone] 10-Jul-2015 7:01 PM

## 2015-04-20 ENCOUNTER — Inpatient Hospital Stay (HOSPITAL_COMMUNITY): Payer: Medicare Other

## 2015-04-20 DIAGNOSIS — Z515 Encounter for palliative care: Secondary | ICD-10-CM

## 2015-04-20 DIAGNOSIS — R1314 Dysphagia, pharyngoesophageal phase: Secondary | ICD-10-CM | POA: Insufficient documentation

## 2015-04-20 LAB — LIPID PANEL
CHOLESTEROL: 190 mg/dL (ref 0–200)
HDL: 44 mg/dL (ref >40)
LDL CALC: 116 mg/dL — AB (ref 0–99)
Total CHOL/HDL Ratio: 4.3 RATIO
Triglycerides: 151 mg/dL — ABNORMAL HIGH (ref <150)
VLDL: 30 mg/dL (ref 0–40)

## 2015-04-20 LAB — COMPREHENSIVE METABOLIC PANEL
ALBUMIN: 3 g/dL — AB (ref 3.5–5.0)
ALT: 29 U/L (ref 14–54)
AST: 34 U/L (ref 15–41)
Alkaline Phosphatase: 65 U/L (ref 38–126)
Anion gap: 8 (ref 5–15)
BILIRUBIN TOTAL: 0.7 mg/dL (ref 0.3–1.2)
BUN: 26 mg/dL — ABNORMAL HIGH (ref 6–20)
CHLORIDE: 103 mmol/L (ref 101–111)
CO2: 25 mmol/L (ref 22–32)
Calcium: 9.1 mg/dL (ref 8.9–10.3)
Creatinine, Ser: 1.11 mg/dL — ABNORMAL HIGH (ref 0.44–1.00)
GFR calc Af Amer: 53 mL/min — ABNORMAL LOW (ref >60)
GFR calc non Af Amer: 46 mL/min — ABNORMAL LOW (ref >60)
Glucose, Bld: 140 mg/dL — ABNORMAL HIGH (ref 70–99)
POTASSIUM: 4.1 mmol/L (ref 3.5–5.1)
SODIUM: 136 mmol/L (ref 135–145)
Total Protein: 6.2 g/dL — ABNORMAL LOW (ref 6.5–8.1)

## 2015-04-20 LAB — GLUCOSE, CAPILLARY
GLUCOSE-CAPILLARY: 112 mg/dL — AB (ref 70–99)
GLUCOSE-CAPILLARY: 142 mg/dL — AB (ref 70–99)
GLUCOSE-CAPILLARY: 144 mg/dL — AB (ref 70–99)
GLUCOSE-CAPILLARY: 146 mg/dL — AB (ref 70–99)
Glucose-Capillary: 151 mg/dL — ABNORMAL HIGH (ref 70–99)
Glucose-Capillary: 117 mg/dL — ABNORMAL HIGH (ref 70–99)
Glucose-Capillary: 110 mg/dL — ABNORMAL HIGH (ref 70–99)
Glucose-Capillary: 123 mg/dL — ABNORMAL HIGH (ref 70–99)

## 2015-04-20 LAB — CBC
HCT: 38.9 % (ref 36.0–46.0)
HEMOGLOBIN: 12.8 g/dL (ref 12.0–15.0)
MCH: 29 pg (ref 26.0–34.0)
MCHC: 32.9 g/dL (ref 30.0–36.0)
MCV: 88.2 fL (ref 78.0–100.0)
PLATELETS: 227 10*3/uL (ref 150–400)
RBC: 4.41 MIL/uL (ref 3.87–5.11)
RDW: 13.2 % (ref 11.5–15.5)
WBC: 13.4 10*3/uL — ABNORMAL HIGH (ref 4.0–10.5)

## 2015-04-20 LAB — SODIUM: Sodium: 137 mmol/L (ref 135–145)

## 2015-04-20 MED ORDER — METOPROLOL TARTRATE 1 MG/ML IV SOLN
2.5000 mg | Freq: Four times a day (QID) | INTRAVENOUS | Status: DC
  Administered 2015-04-20 – 2015-04-21 (×3): 2.5 mg via INTRAVENOUS
  Filled 2015-04-20 (×5): qty 5

## 2015-04-20 MED ORDER — SODIUM CHLORIDE 3 % IV SOLN
INTRAVENOUS | Status: DC
  Administered 2015-04-20: 50 mL/h via INTRAVENOUS
  Filled 2015-04-20 (×4): qty 500

## 2015-04-20 MED ORDER — CETYLPYRIDINIUM CHLORIDE 0.05 % MT LIQD
7.0000 mL | Freq: Two times a day (BID) | OROMUCOSAL | Status: DC
  Administered 2015-04-20 (×2): 7 mL via OROMUCOSAL

## 2015-04-20 NOTE — Progress Notes (Signed)
Pt's pupils unequal on routine Q2h neuro check. Right pupil is now non-reactive. Dr. Roseanne RenoStewart notified, received order for STAT head CT. Will continue to monitor.

## 2015-04-20 NOTE — Progress Notes (Signed)
Pt's head CT showing edema and midline shift according to Dr. Roseanne RenoStewart, received orders from Dr. Roseanne RenoStewart to transfer pt to 74M for placement of CVC and initiation of hypertonic IVF. Family aware of impending transfer.

## 2015-04-20 NOTE — Progress Notes (Signed)
Stroke Team Progress Note  HISTORY  81 y.o. female history of hypertension, hypothyroidism and recently diagnosed atrial fibrillation presenting with acute aphasia and right hemiplegia. She was last seen well at 10:00 last night. She has no previous history of stroke nor TIA. His been taking aspirin daily. CT scan of her head showed large PCA territory cerebral infarction. NIH stroke score NIH stroke score was 24. Patient became nauseated and vomited a large amount of appearing material. Aspiration was also suspected. As per family new onset A-fib diagnosed about 2 weeks ago. Was not on anticoagulation.     SUBJECTIVE Failed speech evaluation this AM  OBJECTIVE Most recent Vital Signs: Filed Vitals:   03/30/2015 2203 04/20/15 0016 04/20/15 0422 04/20/15 0739  BP: 137/68 142/84 129/59 117/60  Pulse: 91 79 71 72  Temp: 99.5 F (37.5 C) 98.2 F (36.8 C) 97.8 F (36.6 C) 97.8 F (36.6 C)  TempSrc: Axillary Oral Oral Axillary  Resp: 19 18 15 18   Height:      Weight:      SpO2: 98% 98% 100% 99%   CBG (last 3)   Recent Labs  04/20/15 0013 04/20/15 0413 04/20/15 0738  GLUCAP 142* 151* 144*    IV Fluid Intake:   . sodium chloride 50 mL/hr at 03/22/2015 1822    MEDICATIONS  . sodium chloride   Intravenous STAT  . antiseptic oral rinse  7 mL Mouth Rinse BID  . aspirin  300 mg Rectal Daily  . ceFEPime (MAXIPIME) IV  2 g Intravenous Q24H  . heparin  5,000 Units Subcutaneous 3 times per day  . levothyroxine  50 mcg Intravenous Daily  . metoprolol  5 mg Intravenous 4 times per day  . sodium chloride  3 mL Intravenous Q12H   PRN:  acetaminophen **OR** acetaminophen, morphine injection, ondansetron **OR** ondansetron (ZOFRAN) IV  Diet:  liquids Activity:  Bedrest DVT Prophylaxis:  SCDs  CLINICALLY SIGNIFICANT STUDIES Basic Metabolic Panel:  Recent Labs Lab 04/06/2015 1410 04/06/2015 1422 04/20/15 0357  NA 135 136 136  K 4.1 4.3 4.1  CL 100 102 103  CO2 21  --  25  GLUCOSE  152* 153* 140*  BUN 28* 36* 26*  CREATININE 1.36* 1.20* 1.11*  CALCIUM 9.6  --  9.1   Liver Function Tests:  Recent Labs Lab 03/24/2015 1410 04/20/15 0357  AST 32 34  ALT 34 29  ALKPHOS 74 65  BILITOT 0.8 0.7  PROT 7.0 6.2*  ALBUMIN 3.8 3.0*   CBC:  Recent Labs Lab 04/05/2015 1410 03/31/2015 1422 04/20/15 0357  WBC 13.2*  --  13.4*  NEUTROABS 11.5*  --   --   HGB 14.2 15.6* 12.8  HCT 43.3 46.0 38.9  MCV 88.2  --  88.2  PLT 204  --  227   Coagulation:  Recent Labs Lab 04/06/2015 1410  LABPROT 13.8  INR 1.05   Cardiac Enzymes: No results for input(s): CKTOTAL, CKMB, CKMBINDEX, TROPONINI in the last 168 hours. Urinalysis:  Recent Labs Lab 03/26/2015 1330  COLORURINE YELLOW  LABSPEC 1.020  PHURINE 5.0  GLUCOSEU NEGATIVE  HGBUR NEGATIVE  BILIRUBINUR NEGATIVE  KETONESUR NEGATIVE  PROTEINUR NEGATIVE  UROBILINOGEN 0.2  NITRITE NEGATIVE  LEUKOCYTESUR NEGATIVE   Lipid Panel    Component Value Date/Time   CHOL 190 04/20/2015 0357   TRIG 151* 04/20/2015 0357   HDL 44 04/20/2015 0357   CHOLHDL 4.3 04/20/2015 0357   VLDL 30 04/20/2015 0357   LDLCALC 116* 04/20/2015 0357  HgbA1C No results found for: HGBA1C  Urine Drug Screen:     Component Value Date/Time   LABOPIA NONE DETECTED 04/14/2015 1330   COCAINSCRNUR NONE DETECTED 03/21/2015 1330   LABBENZ POSITIVE* 04/02/2015 1330   AMPHETMU NONE DETECTED 03/23/2015 1330   THCU NONE DETECTED 04/03/2015 1330   LABBARB NONE DETECTED 04/05/2015 1330    Alcohol Level:  Recent Labs Lab 04/16/2015 1413  ETH <5    Ct Head Wo Contrast  03/27/2015   CLINICAL DATA:  Extensive right-sided  paresis/weakness  EXAM: CT HEAD WITHOUT CONTRAST  TECHNIQUE: Contiguous axial images were obtained from the base of the skull through the vertex without intravenous contrast.  COMPARISON:  None.  FINDINGS: The ventricles are normal in size and configuration. There is extensive cytotoxic edema throughout the left temporal and occipital lobes.  There is also decreased attenuation in the superior left cerebellum compared to the right. There is decreased attenuation throughout the left posterior lentiform nucleus as well as involving the posterior limb of the left external capsule. Left thalamus as well as the anterior left basal ganglia regions do not appear involved. There is sulcal effacement in these areas of cytotoxic edema. There is no hemorrhage, well-defined mass, or midline shift. There is no subdural or epidural fluid collection.  The bony calvarium appears intact. The mastoid air cells are clear. There is mucosal thickening in both maxillary antra with retention cysts in both maxillary antra, more on the right than on the left.  IMPRESSION: Extensive evidence of acute infarct involving essentially all of the left occipital and temporal lobes as well as the posterior left lentiform nucleus and portions of the superior left cerebellum. This finding is consistent with an acute infarct involving most if not all of the left posterior cerebral artery distribution. No hemorrhage or well-defined mass. No midline shift.  These results were called by telephone at the time of interpretation on 04/01/2015 at 3:03 pm to Dr. Benjiman Core , who verbally acknowledged these results.   Electronically Signed   By: Bretta Bang III M.D.   On: 03/21/2015 15:03   Mr Maxine Glenn Head Wo Contrast  03/26/2015   CLINICAL DATA:  Right-sided weakness and aphasia.  Abnormal head CT.  EXAM: MRI HEAD WITHOUT CONTRAST  MRA HEAD WITHOUT CONTRAST  TECHNIQUE: Multiplanar, multiecho pulse sequences of the brain and surrounding structures were obtained without intravenous contrast. Angiographic images of the head were obtained using MRA technique without contrast.  COMPARISON:  Head CT ear earlier same day  FINDINGS: MRI HEAD FINDINGS  The brainstem, and cerebellum are normal. Right cerebral hemisphere shows a 7 x 12 mm subacute infarction in the right frontal subcortical white  matter. The left hemisphere shows acute infarction of the entire left temporal lobe, occipital lobe and partially affecting the left thalamus, lentiform nucleus, opercular region in the frontal lobe and parietal lobe. This involves approximately 70% of the entire left hemisphere. There is mild swelling and early mass effect but no midline shift at this time. No hemorrhagic transformation at this time. No hydrocephalus. No extra-axial collection. No neoplastic mass lesion.  MRA HEAD FINDINGS  The right internal carotid artery is widely patent into the brain without stenosis. This vessel supplies the right middle cerebral artery territory, the right posterior cerebral artery territory PA fetal origin in the right anterior cerebral artery territory. No stenosis of those vessels. The a patent anterior communicating artery, there is supply of the left anterior cerebral artery with retrograde flow back into the  A1 segment. There is complete occlusion of the left internal carotid artery and of the left middle cerebral artery and fetal origin posterior cerebral artery. The vertebral arteries are diminutive but patent to the basilar. No basilar stenosis, though this is a small vessel. The basilar artery supplies only the superior cerebellar arteries because of the fetal origin of both PCAs.  IMPRESSION: Acute infarction affecting 70% of the left cerebral hemisphere, only the anterior cerebral artery territory being spared. There is complete occlusion of the left internal carotid artery, the left middle cerebral artery in the left posterior cerebral artery which took a complete fetal origin from the anterior circulation. Flow through a patent anterior communicating artery results an supply of the left anterior cerebral artery territory. Right anterior circulation is intact. Posterior circulation is intact, but is diminutive because of bilateral fetal origin of the posterior cerebral arteries.  Developing swelling of the  region of infarction but no midline shift at this moment. That is likely to develop same however. No sign of hemorrhagic transformation.  Small subacute infarction in the right frontal white matter.   Electronically Signed   By: Paulina FusiMark  Shogry M.D.   On: 04/08/2015 21:47   Mr Brain Wo Contrast  04/01/2015   CLINICAL DATA:  Right-sided weakness and aphasia.  Abnormal head CT.  EXAM: MRI HEAD WITHOUT CONTRAST  MRA HEAD WITHOUT CONTRAST  TECHNIQUE: Multiplanar, multiecho pulse sequences of the brain and surrounding structures were obtained without intravenous contrast. Angiographic images of the head were obtained using MRA technique without contrast.  COMPARISON:  Head CT ear earlier same day  FINDINGS: MRI HEAD FINDINGS  The brainstem, and cerebellum are normal. Right cerebral hemisphere shows a 7 x 12 mm subacute infarction in the right frontal subcortical white matter. The left hemisphere shows acute infarction of the entire left temporal lobe, occipital lobe and partially affecting the left thalamus, lentiform nucleus, opercular region in the frontal lobe and parietal lobe. This involves approximately 70% of the entire left hemisphere. There is mild swelling and early mass effect but no midline shift at this time. No hemorrhagic transformation at this time. No hydrocephalus. No extra-axial collection. No neoplastic mass lesion.  MRA HEAD FINDINGS  The right internal carotid artery is widely patent into the brain without stenosis. This vessel supplies the right middle cerebral artery territory, the right posterior cerebral artery territory PA fetal origin in the right anterior cerebral artery territory. No stenosis of those vessels. The a patent anterior communicating artery, there is supply of the left anterior cerebral artery with retrograde flow back into the A1 segment. There is complete occlusion of the left internal carotid artery and of the left middle cerebral artery and fetal origin posterior cerebral  artery. The vertebral arteries are diminutive but patent to the basilar. No basilar stenosis, though this is a small vessel. The basilar artery supplies only the superior cerebellar arteries because of the fetal origin of both PCAs.  IMPRESSION: Acute infarction affecting 70% of the left cerebral hemisphere, only the anterior cerebral artery territory being spared. There is complete occlusion of the left internal carotid artery, the left middle cerebral artery in the left posterior cerebral artery which took a complete fetal origin from the anterior circulation. Flow through a patent anterior communicating artery results an supply of the left anterior cerebral artery territory. Right anterior circulation is intact. Posterior circulation is intact, but is diminutive because of bilateral fetal origin of the posterior cerebral arteries.  Developing swelling of the region  of infarction but no midline shift at this moment. That is likely to develop same however. No sign of hemorrhagic transformation.  Small subacute infarction in the right frontal white matter.   Electronically Signed   By: Paulina Fusi M.D.   On: 04/18/2015 21:47   Dg Chest Portable 1 View  04/06/2015   CLINICAL DATA:  Weakness and hypertension  EXAM: PORTABLE CHEST - 1 VIEW  COMPARISON:  None.  FINDINGS: There is no edema or consolidation. Heart is borderline enlarged with pulmonary vascularity within normal limits. No adenopathy. There is thoracic levoscoliosis.  IMPRESSION: No edema or consolidation.  Heart borderline prominent.   Electronically Signed   By: Bretta Bang III M.D.   On: 04/13/2015 14:44    CT of the brain  Extensive evidence of acute infarct involving essentially all of the left occipital and temporal lobes as well as the posterior left lentiform nucleus and portions of the superior left cerebellum  MRI of the brain  Acute infarction affecting 70% of the left cerebral hemisphere, only the anterior cerebral artery  territory being spared  MRA of the brain    Carotid Doppler    2D Echocardiogram  Pending   CXR    EKG  atrial fibrillation, rate 80s. For complete results please see formal report.   Therapy Recommendations pending  Physical Exam   Neurologic Examination: Mental Status: Patient was lethargic and globally aphasic. She had gaze deviation to the left side with inattentiveness to the right. Cranial Nerves: II-dense right homonymous hemianopsia. III/IV/VI-Pupils were equal and reacted. Eyes were deviated to the left side conjugately with no movement beyond midline toward the right.  VII-moderate right lower facial weakness. VIII-normal. X-no speech output. Motor: Right hemiplegia with moderately increased muscle tone in the arm and leg; normal strength and tone of left extremities. Sensory: Unable to adequately assess. Deep Tendon Reflexes: 2+ and left extremities: Unable to elicit reflexes in right extremities due to increased muscle tone. Plantars: Extensor on the right and mute on the left  ASSESSMENT Ms. Carol Baker is a 81 y.o. female with history of hypertension, hypothyroidism and recently diagnosed atrial fibrillation presenting with acute aphasia and right hemiplegia. L ICA and L MCA complete occlusion. Pt also has L fetal PCA. Stroke most of L hemisphere.      Hospital day # 1  TREATMENT/PLAN  S/p discussion with family they appear to want aggressive care at this point.  Explained the likely hood of intracranial swelling due to big stroke.  Con't rectal ASA  Will hold off Hypertonic saline or mannitol unless change on exam  Will order repeat CTH in AM  Please keep in step and don't transfer to floor as can decompensate rapidly.    Further family discussions.      SIGNED    To contact Stroke Continuity provider, please refer to [email or web address]. After hours, contact General Neurology

## 2015-04-20 NOTE — Progress Notes (Signed)
PT Cancellation Note  Patient Details Name: Carol FootmanRebecca L Ostrander MRN: [id number] DOB: [birth year]   Cancelled Treatment:    Reason Eval/Treat Not Completed: Fatigue/lethargy limiting ability to participate   Will follow up for PT eval tomorrow; Thanks, Van ClinesHolly Dalani Mette, PT  Ac[facility] Services Pager [phone] Office [phone]    Van ClinesGarrigan, Solly Derasmo Hamff 04/20/2015, 1:11 PM

## 2015-04-20 NOTE — Progress Notes (Signed)
Dr. Loretha BrasilZeylikman with stroke team notified of changes in pt's neuro assessment (pt is no longer able to follow commands with RLE). No new orders at this time. No s/s of acute distress noted, VS stable. Will continue to monitor.

## 2015-04-20 NOTE — Progress Notes (Signed)
[id number] foley placed per MD order/protocol d/t unstable. Pt tolerated procedure well Sheron Nightingalearole RN present as second person during insertion. Peri care care with soap and water provided prior to insertion and sterile procedure maintained throughout. Will continue to monitor.

## 2015-04-20 NOTE — Evaluation (Signed)
Clinical/Bedside Swallow Evaluation Patient Details  Name: Carol Baker MRN: [id number] Date of Birth: [birth year]  Today's Date: 04/20/2015 Time: SLP Start Time (ACUTE ONLY): 0920 SLP Stop Time (ACUTE ONLY): 0941 SLP Time Calculation (min) (ACUTE ONLY): 21 min  Past Medical History:  Past Medical History  Diagnosis Date  . Hypertension   . Hypothyroid   . Atrial fibrillation    Past Surgical History: History reviewed. No pertinent past surgical history. HPI:  81 year old female with history of HTN, a-fib admitted with right sided weakness and aphasia. CT with evidence of a CVA involving the majority of the left occipital and temporal lobes as well as possible left lentiform nucleus and superior left cerebellum. Of note, concerns of emesis with possible aspiration noted in ED.    Assessment / Plan / Recommendation Clinical Impression  Bedside swallow evaluation complete. Limited exam due to both neurological condition and patient lethargy, requiring maximum cues for arousal. Oral care provided with subsequent improvement in ability to mobilize lips and tongue however CN VII and XII persists. Patient able to orally accept po trials (ice chips), transit bolus, and initiate a swallow with indicated of decreased airway protection in 1/4 trials however requires maximum verbal and tactile cueing for awareness of bolus and sustained attention to task, falling asleep frequently during swallow sequence. Education complete regarding results and recommendations with family. Recommend NPO with temporary non-oral means of nutrition if patient is going to be treated for acute condition. SLP will f/u 5/2 for improvements and readiness to initiate a po diet.     Aspiration Risk  Severe    Diet Recommendation NPO;Alternative means - temporary   Medication Administration: Via alternative means    Other  Recommendations Oral Care Recommendations: Oral care QID      Frequency and Duration    2 weeks    Pertinent Vitals/Pain n/a        Swallow Study    General Other Pertinent Information: 81 year old female with history of HTN, a-fib admitted with right sided weakness and aphasia. CT with evidence of a CVA involving the majority of the left occipital and temporal lobes as well as possible left lentiform nucleus and superior left cerebellum. Of note, concerns of emesis with possible aspiration noted in ED.  Type of Study: Other (Comment) (swallow evaluation) Diet Prior to this Study: NPO Temperature Spikes Noted: No Respiratory Status: Room air History of Recent Intubation: No Behavior/Cognition: Lethargic/Drowsy;Requires cueing Oral Cavity - Dentition: Adequate natural dentition/normal for age Self-Feeding Abilities: Total assist Patient Positioning: Upright in bed Baseline Vocal Quality: Not observed (non-verbal) Volitional Cough: Cognitively unable to elicit Volitional Swallow: Unable to elicit (spontaneous swallow noted)    Oral/Motor/Sensory Function Overall Oral Motor/Sensory Function: Impaired Labial ROM: Reduced right;Reduced left (right worse than left) Labial Symmetry: Abnormal symmetry right Labial Strength: Reduced Lingual ROM: Reduced right;Reduced left Lingual Symmetry: Within Functional Limits Lingual Strength: Reduced Facial ROM: Reduced right Facial Symmetry: Right droop Facial Strength: Reduced Velum:  (unable to assess) Mandible: Within Functional Limits   Ice Chips Ice chips: Impaired Presentation: Spoon Oral Phase Impairments: Reduced labial seal;Reduced lingual movement/coordination;Impaired anterior to posterior transit;Poor awareness of bolus Oral Phase Functional Implications: Oral holding;Prolonged oral transit Pharyngeal Phase Impairments: Cough - Immediate (in 1/4 trials)   Thin Liquid Thin Liquid: Not tested    Nectar Thick Nectar Thick Liquid: Not tested   Honey Thick Honey Thick Liquid: Not tested   Puree Puree: Not tested   Solid   GO  Carol Lango MA, CCC-SLP [phone]  Solid: Not tested       Carol Baker Carol Baker 04/20/2015,9:50 AM

## 2015-04-20 NOTE — Care Management Note (Signed)
Case Management Note  Patient Details  Name: Jimmy FootmanRebecca L Colina MRN: [id number] Date of Birth: 1933-10-26  Subjective/Objective:                 PTA  from home alone admitted with CVA.  Action/Plan: Discharge to home/SNF  when medically stable. CM to f/u with d/c disposition  Expected Discharge Date:  04/22/15               Expected Discharge Plan:  Skilled Nursing Facility  In-House Referral:  Clinical Social Work  Discharge planning Services  CM Consult  Post Acute Care Choice:    Choice offered to:     DME Arranged:    DME Agency:     HH Arranged:    HH Agency:     Status of Service:     Medicare Important Message Given:    Date Medicare IM Given:    Medicare IM give by:    Date Additional Medicare IM Given:    Additional Medicare Important Message give by:     If discussed at Long Length of Stay Meetings, dates discussed:    Additional Comments: Emergency contact  Lillette BoxerDouglas  Vanderschaaf (son) [phone] Epifanio LeschesCole, Natoria Archibald Hudson, RN 04/20/2015, 1:15 PM

## 2015-04-20 NOTE — Progress Notes (Signed)
[facility] PROGRESS NOTE  Carol Baker:[id number] DOB: 12-30-1932 DOA: 03/26/2015 PCP: No primary care provider on file.  Assessment/Plan: 1. Acute CVA 1. Extensive L sided acute CVA noted on CT head 2. Neurology is following 3. Carotid dopplers and 2d echo ordered, pending 4. MRI brain 5. ASA suppository ordered 6. Keep NPO 7. SLP recs for NPO 8. PT/OT consulted 9. Overall poor prognosis. Appreciate Palliative Care input 10. Family still desires agressive mgt 11. Neurology recs for cont rectal ASA with repeat CT head in AM 2. Possible Aspiration Pneumonia 1. Emesis and suspected aspiration witnessed in ED 2. Will cont pt on empiric cefepime 3. HTN 1. BP presently stable 2. Will hold ACEI to allow for permissive HTN 3. Continue IV beta blocker per below but will decrease dose from 5mg  to 2.5mg  to allow HTN 4. Chronic afib 1. Presently rate controlled 2. Will continue on 2.5mg  IV lopressor to ensure rate control 5. Hypothyroid 1. Will continue on IV daily 6. DVT prophylaxis 1. Heparin subQ  Code Status: DNR Family Communication: Pt in room, son at bedside (indicate person spoken with, relationship, and if by phone, the number) Disposition Plan: Pending   Consultants:  Neurology  Palliative Care  Procedures:    Antibiotics:  Cefepime 4/30>>> (indicate start date, and stop date if known)  HPI/Subjective: Pt remains non-verbal, but follows commands  Objective: Filed Vitals:   04/20/15 0422 04/20/15 0739 04/20/15 1056 04/20/15 1200  BP: 129/59 117/60 133/65   Pulse: 71 72 74   Temp: 97.8 F (36.6 C) 97.8 F (36.6 C)  98.8 F (37.1 C)  TempSrc: Oral Axillary  Oral  Resp: 15 18 18    Height:      Weight:      SpO2: 100% 99% 97%     Intake/Output Summary (Last 24 hours) at 04/20/15 1715 Last data filed at 04/20/15 1056  Gross per 24 hour  Intake    750 ml  Output    675 ml  Net     75 ml   Filed Weights   03/22/2015 1321 04/18/2015  1831  Weight: 97.523 kg (215 lb) 96.5 kg (212 lb 11.9 oz)    Exam:   General:  Awake, in nad, nonverbal  Cardiovascular: regular, s1, s2  Respiratory: normal resp effort, no wheezing  Abdomen: soft, obese, nondistended  Musculoskeletal: perfused, no clubbing   Data Reviewed: Basic Metabolic Panel:  Recent Labs Lab 04/10/2015 1410 04/14/2015 1422 04/20/15 0357  NA 135 136 136  K 4.1 4.3 4.1  CL 100 102 103  CO2 21  --  25  GLUCOSE 152* 153* 140*  BUN 28* 36* 26*  CREATININE 1.36* 1.20* 1.11*  CALCIUM 9.6  --  9.1   Liver Function Tests:  Recent Labs Lab 03/28/2015 1410 04/20/15 0357  AST 32 34  ALT 34 29  ALKPHOS 74 65  BILITOT 0.8 0.7  PROT 7.0 6.2*  ALBUMIN 3.8 3.0*   No results for input(s): LIPASE, AMYLASE in the last 168 hours. No results for input(s): AMMONIA in the last 168 hours. CBC:  Recent Labs Lab 03/26/2015 1410 04/02/2015 1422 04/20/15 0357  WBC 13.2*  --  13.4*  NEUTROABS 11.5*  --   --   HGB 14.2 15.6* 12.8  HCT 43.3 46.0 38.9  MCV 88.2  --  88.2  PLT 204  --  227   Cardiac Enzymes: No results for input(s): CKTOTAL, CKMB, CKMBINDEX, TROPONINI in the last 168 hours. BNP (last 3  results) No results for input(s): BNP in the last 8760 hours.  ProBNP (last 3 results) No results for input(s): PROBNP in the last 8760 hours.  CBG:  Recent Labs Lab 2015-05-14 2159 04/20/15 0013 04/20/15 0413 04/20/15 0738 04/20/15 1209  GLUCAP 146* 142* 151* 144* 117*    Recent Results (from the past 240 hour(s))  MRSA PCR Screening     Status: None   Collection Time: 2015/05/14  6:07 PM  Result Value Ref Range Status   MRSA by PCR NEGATIVE NEGATIVE Final    Comment:        The GeneXpert MRSA Assay (FDA approved for NASAL specimens only), is one component of a comprehensive MRSA colonization surveillance program. It is not intended to diagnose MRSA infection nor to guide or monitor treatment for MRSA infections.      Studies: Ct Head Wo  Contrast  05/14/2015   CLINICAL DATA:  Extensive right-sided  paresis/weakness  EXAM: CT HEAD WITHOUT CONTRAST  TECHNIQUE: Contiguous axial images were obtained from the base of the skull through the vertex without intravenous contrast.  COMPARISON:  None.  FINDINGS: The ventricles are normal in size and configuration. There is extensive cytotoxic edema throughout the left temporal and occipital lobes. There is also decreased attenuation in the superior left cerebellum compared to the right. There is decreased attenuation throughout the left posterior lentiform nucleus as well as involving the posterior limb of the left external capsule. Left thalamus as well as the anterior left basal ganglia regions do not appear involved. There is sulcal effacement in these areas of cytotoxic edema. There is no hemorrhage, well-defined mass, or midline shift. There is no subdural or epidural fluid collection.  The bony calvarium appears intact. The mastoid air cells are clear. There is mucosal thickening in both maxillary antra with retention cysts in both maxillary antra, more on the right than on the left.  IMPRESSION: Extensive evidence of acute infarct involving essentially all of the left occipital and temporal lobes as well as the posterior left lentiform nucleus and portions of the superior left cerebellum. This finding is consistent with an acute infarct involving most if not all of the left posterior cerebral artery distribution. No hemorrhage or well-defined mass. No midline shift.  These results were called by telephone at the time of interpretation on 05/14/2015 at 3:03 pm to Dr. Benjiman Core , who verbally acknowledged these results.   Electronically Signed   By: Bretta Bang III M.D.   On: 2015/05/14 15:03   Mr Maxine Glenn Head Wo Contrast  2015-05-14   CLINICAL DATA:  Right-sided weakness and aphasia.  Abnormal head CT.  EXAM: MRI HEAD WITHOUT CONTRAST  MRA HEAD WITHOUT CONTRAST  TECHNIQUE: Multiplanar,  multiecho pulse sequences of the brain and surrounding structures were obtained without intravenous contrast. Angiographic images of the head were obtained using MRA technique without contrast.  COMPARISON:  Head CT ear earlier same day  FINDINGS: MRI HEAD FINDINGS  The brainstem, and cerebellum are normal. Right cerebral hemisphere shows a 7 x 12 mm subacute infarction in the right frontal subcortical white matter. The left hemisphere shows acute infarction of the entire left temporal lobe, occipital lobe and partially affecting the left thalamus, lentiform nucleus, opercular region in the frontal lobe and parietal lobe. This involves approximately 70% of the entire left hemisphere. There is mild swelling and early mass effect but no midline shift at this time. No hemorrhagic transformation at this time. No hydrocephalus. No extra-axial collection. No neoplastic mass  lesion.  MRA HEAD FINDINGS  The right internal carotid artery is widely patent into the brain without stenosis. This vessel supplies the right middle cerebral artery territory, the right posterior cerebral artery territory PA fetal origin in the right anterior cerebral artery territory. No stenosis of those vessels. The a patent anterior communicating artery, there is supply of the left anterior cerebral artery with retrograde flow back into the A1 segment. There is complete occlusion of the left internal carotid artery and of the left middle cerebral artery and fetal origin posterior cerebral artery. The vertebral arteries are diminutive but patent to the basilar. No basilar stenosis, though this is a small vessel. The basilar artery supplies only the superior cerebellar arteries because of the fetal origin of both PCAs.  IMPRESSION: Acute infarction affecting 70% of the left cerebral hemisphere, only the anterior cerebral artery territory being spared. There is complete occlusion of the left internal carotid artery, the left middle cerebral artery in  the left posterior cerebral artery which took a complete fetal origin from the anterior circulation. Flow through a patent anterior communicating artery results an supply of the left anterior cerebral artery territory. Right anterior circulation is intact. Posterior circulation is intact, but is diminutive because of bilateral fetal origin of the posterior cerebral arteries.  Developing swelling of the region of infarction but no midline shift at this moment. That is likely to develop same however. No sign of hemorrhagic transformation.  Small subacute infarction in the right frontal white matter.   Electronically Signed   By: Paulina FusiMark  Shogry M.D.   On: 03/26/2015 21:47   Mr Brain Wo Contrast  03/27/2015   CLINICAL DATA:  Right-sided weakness and aphasia.  Abnormal head CT.  EXAM: MRI HEAD WITHOUT CONTRAST  MRA HEAD WITHOUT CONTRAST  TECHNIQUE: Multiplanar, multiecho pulse sequences of the brain and surrounding structures were obtained without intravenous contrast. Angiographic images of the head were obtained using MRA technique without contrast.  COMPARISON:  Head CT ear earlier same day  FINDINGS: MRI HEAD FINDINGS  The brainstem, and cerebellum are normal. Right cerebral hemisphere shows a 7 x 12 mm subacute infarction in the right frontal subcortical white matter. The left hemisphere shows acute infarction of the entire left temporal lobe, occipital lobe and partially affecting the left thalamus, lentiform nucleus, opercular region in the frontal lobe and parietal lobe. This involves approximately 70% of the entire left hemisphere. There is mild swelling and early mass effect but no midline shift at this time. No hemorrhagic transformation at this time. No hydrocephalus. No extra-axial collection. No neoplastic mass lesion.  MRA HEAD FINDINGS  The right internal carotid artery is widely patent into the brain without stenosis. This vessel supplies the right middle cerebral artery territory, the right posterior  cerebral artery territory PA fetal origin in the right anterior cerebral artery territory. No stenosis of those vessels. The a patent anterior communicating artery, there is supply of the left anterior cerebral artery with retrograde flow back into the A1 segment. There is complete occlusion of the left internal carotid artery and of the left middle cerebral artery and fetal origin posterior cerebral artery. The vertebral arteries are diminutive but patent to the basilar. No basilar stenosis, though this is a small vessel. The basilar artery supplies only the superior cerebellar arteries because of the fetal origin of both PCAs.  IMPRESSION: Acute infarction affecting 70% of the left cerebral hemisphere, only the anterior cerebral artery territory being spared. There is complete occlusion of the  left internal carotid artery, the left middle cerebral artery in the left posterior cerebral artery which took a complete fetal origin from the anterior circulation. Flow through a patent anterior communicating artery results an supply of the left anterior cerebral artery territory. Right anterior circulation is intact. Posterior circulation is intact, but is diminutive because of bilateral fetal origin of the posterior cerebral arteries.  Developing swelling of the region of infarction but no midline shift at this moment. That is likely to develop same however. No sign of hemorrhagic transformation.  Small subacute infarction in the right frontal white matter.   Electronically Signed   By: Paulina Fusi M.D.   On: 04-28-2015 21:47   Dg Chest Portable 1 View  2015/04/28   CLINICAL DATA:  Weakness and hypertension  EXAM: PORTABLE CHEST - 1 VIEW  COMPARISON:  None.  FINDINGS: There is no edema or consolidation. Heart is borderline enlarged with pulmonary vascularity within normal limits. No adenopathy. There is thoracic levoscoliosis.  IMPRESSION: No edema or consolidation.  Heart borderline prominent.   Electronically  Signed   By: Bretta Bang III M.D.   On: Apr 28, 2015 14:44    Scheduled Meds: . sodium chloride   Intravenous STAT  . antiseptic oral rinse  7 mL Mouth Rinse BID  . aspirin  300 mg Rectal Daily  . ceFEPime (MAXIPIME) IV  2 g Intravenous Q24H  . heparin  5,000 Units Subcutaneous 3 times per day  . levothyroxine  50 mcg Intravenous Daily  . metoprolol  2.5 mg Intravenous 4 times per day  . sodium chloride  3 mL Intravenous Q12H   Continuous Infusions: . sodium chloride 50 mL/hr at Apr 28, 2015 9147    Active Problems:   Stroke   HTN (hypertension)   Chronic a-fib   Hypothyroidism   Palliative care encounter   Dysphagia, pharyngoesophageal phase   Adamarys Shall K  [facility] Pager [phone]. If 7PM-7AM, please contact night-coverage at [email or web address], password [facility] 04/20/2015, 5:15 PM  LOS: 1 day

## 2015-04-20 NOTE — Procedures (Signed)
Central Venous Catheter Insertion Procedure Note Carol FootmanRebecca L Baker [id number] Sep 21, 1933  Procedure: Insertion of Central Venous Catheter Indications: Assessment of intravascular volume, Drug and/or fluid administration and Frequent blood sampling  Procedure Details Consent: Risks of procedure as well as the alternatives and risks of each were explained to the (patient/caregiver).  Consent for procedure obtained. Time Out: Verified patient identification, verified procedure, site/side was marked, verified correct patient position, special equipment/implants available, medications/allergies/relevent history reviewed, required imaging and test results available.  Performed  Maximum sterile technique was used including antiseptics, cap, gloves, gown, hand hygiene, mask and sheet. Skin prep: Chlorhexidine; local anesthetic administered RIJ access was attempted first. Accessed vessel under ultrasound guidance, however was unable to pass guidewire. Attempted x 3 without success. Wire/needle withdrawn. CXR showed no PTX. Then attempted LIJ. Chlorhexidine used for skin prep. A antimicrobial bonded/coated triple lumen catheter was placed in the left internal jugular vein using the Seldinger technique. Ultrasound guidance used.Yes.   Catheter placed to 20 cm. Blood aspirated via all 3 ports and then flushed x 3. Line sutured x 2 and dressing applied.  Evaluation Blood flow good Complications: No apparent complications Patient did tolerate procedure well. Chest X-ray ordered to verify placement.  CXR: pending.  Carol RoachPaul Claud Baker, AGACNP-BC [facility] Pager [phone] or [phone]  04/20/2015 10:17 PM

## 2015-04-20 NOTE — Consult Note (Signed)
Patient HD:[id number] Carol Baker      DOB: 1933-07-03      GXQ:[id number]     Consult Note from the Palliative Medicine Team at [facility] Requested by: Dr Wyline Copas     PCP: No primary care provider on file. Reason for [place]         Phone Number:None  Assessment/Recommendations: 81 yo female with PMHx of Afib, HTN who presented with large left sided ischemic stroke  1.  Code Status: DNR documented  2. GOC: Met with son Marden Noble today, several sisters, nephew, etc.  They have had a chance to discuss care with Neurology as noted.  Son spoke with SW to start process of looking into nursing facility as well.  We talked about stroke some more today and why prognostically it is felt to be so poor (admission exam, size of stroke, patient age, etc).  They also heard about importance of next 48h, risk of swelling, etc.  We even reviewed images of MRI so they had a better picture of just how severe CVA is.  Carol Baker was having more functional trouble past few months related to degenerative disc/sciatica.  Family estimates she has not been out of house since January.  They expect long term care requirement.  As neurology notes, they want to continue aggressive care at this point. One of family members was stopped by nephew when asking questions related to prognosis to comment that "Dr's dont know, only god knows". I suspect they are still trying to process this new information. Plan for head CT tomorrow.  I will continue to follow along with family.   3. Symptom Management:   1. Dysphagia- Failed swallow eval in setting of acute CVA.  Likely would require PEG if family seeking aggressive care. Would be high risk for aspiration in long-term with PEG.   Will continue to try and put this discussion in context of her care going forward based on trajectory.   4. Psychosocial/Spiritual: 2 sons. Lives in [place].  Enjoyed cooking. Less mobile/active recently.   Brief HPI: 81 yo female with PMHx of  Afib on ASA, HTN, Hypothyroidism admitted with aphasia and right sided weakness. Admission CT noted to have L occipital and temporal lobe CVA consistent with PCA stroke.  Also concern for emesis and resultant aspiration. Follow-up MRI/A revealed 70% infarct involving L MCA and PCA with only anterior cerebral artery circulation apparently being spared.  Palliative care consulted for assistance with goals of care.     ROS: Unable to obtain with aphasia, large stroke, inability to consistently follow commands.     PMH:  Past Medical History  Diagnosis Date  . Hypertension   . Hypothyroid   . Atrial fibrillation      XKG:YJEHUDJ reviewed. No pertinent past surgical history. I have reviewed the [facility] and SH and  If appropriate update it with new information. Allergies  Allergen Reactions  . Penicillins Swelling   Scheduled Meds: . sodium chloride   Intravenous STAT  . antiseptic oral rinse  7 mL Mouth Rinse BID  . aspirin  300 mg Rectal Daily  . ceFEPime (MAXIPIME) IV  2 g Intravenous Q24H  . heparin  5,000 Units Subcutaneous 3 times per day  . levothyroxine  50 mcg Intravenous Daily  . metoprolol  5 mg Intravenous 4 times per day  . sodium chloride  3 mL Intravenous Q12H   Continuous Infusions: . sodium chloride 50 mL/hr at 04/04/2015 1822   PRN Meds:.acetaminophen **OR**  acetaminophen, morphine injection, ondansetron **OR** ondansetron (ZOFRAN) IV    BP 133/65 mmHg  Pulse 74  Temp(Src) 97.8 F (36.6 C) (Axillary)  Resp 18  Ht 5' 4"  (1.626 m)  Wt 96.5 kg (212 lb 11.9 oz)  BMI 36.50 kg/m2  SpO2 97%   PPS:20   Intake/Output Summary (Last 24 hours) at 04/20/15 1245 Last data filed at 04/20/15 0800  Gross per 24 hour  Intake    750 ml  Output    575 ml  Net    175 ml    Physical Exam:  General: Alert, NAD HEENT:  [place], dry mm Neck: supple, no adenopathy Chest:   CTAB CVS: Regular Rate Abdomen: soft, NT Ext: warm Neuro: follows some commands, unable to move RUE,  initiates some movement with RLE. Able to move leg/arm on right.    Labs: CBC    Component Value Date/Time   WBC 13.4* 04/20/2015 0357   WBC 8.7 07/25/2013 2306   RBC 4.41 04/20/2015 0357   RBC 4.43 07/25/2013 2306   HGB 12.8 04/20/2015 0357   HGB 13.3 07/25/2013 2306   HCT 38.9 04/20/2015 0357   HCT 38.7 07/25/2013 2306   PLT 227 04/20/2015 0357   PLT 245 07/25/2013 2306   MCV 88.2 04/20/2015 0357   MCV 87 07/25/2013 2306   MCH 29.0 04/20/2015 0357   MCH 30.0 07/25/2013 2306   MCHC 32.9 04/20/2015 0357   MCHC 34.4 07/25/2013 2306   RDW 13.2 04/20/2015 0357   RDW 13.8 07/25/2013 2306   LYMPHSABS 0.9 04/07/2015 1410   MONOABS 0.8 03/27/2015 1410   EOSABS 0.0 04/07/2015 1410   BASOSABS 0.0 03/22/2015 1410    BMET    Component Value Date/Time   NA 136 04/20/2015 0357   NA 139 07/25/2013 2306   K 4.1 04/20/2015 0357   K 4.3 07/25/2013 2306   CL 103 04/20/2015 0357   CL 106 07/25/2013 2306   CO2 25 04/20/2015 0357   CO2 31 07/25/2013 2306   GLUCOSE 140* 04/20/2015 0357   GLUCOSE 108* 07/25/2013 2306   BUN 26* 04/20/2015 0357   BUN 29* 07/25/2013 2306   CREATININE 1.11* 04/20/2015 0357   CREATININE 1.33* 07/25/2013 2306   CALCIUM 9.1 04/20/2015 0357   CALCIUM 10.0 07/25/2013 2306   GFRNONAA 46* 04/20/2015 0357   GFRNONAA 38* 07/25/2013 2306   GFRAA 53* 04/20/2015 0357   GFRAA 45* 07/25/2013 2306    CMP     Component Value Date/Time   NA 136 04/20/2015 0357   NA 139 07/25/2013 2306   K 4.1 04/20/2015 0357   K 4.3 07/25/2013 2306   CL 103 04/20/2015 0357   CL 106 07/25/2013 2306   CO2 25 04/20/2015 0357   CO2 31 07/25/2013 2306   GLUCOSE 140* 04/20/2015 0357   GLUCOSE 108* 07/25/2013 2306   BUN 26* 04/20/2015 0357   BUN 29* 07/25/2013 2306   CREATININE 1.11* 04/20/2015 0357   CREATININE 1.33* 07/25/2013 2306   CALCIUM 9.1 04/20/2015 0357   CALCIUM 10.0 07/25/2013 2306   PROT 6.2* 04/20/2015 0357   ALBUMIN 3.0* 04/20/2015 0357   AST 34 04/20/2015  0357   ALT 29 04/20/2015 0357   ALKPHOS 65 04/20/2015 0357   BILITOT 0.7 04/20/2015 0357   GFRNONAA 46* 04/20/2015 0357   GFRNONAA 38* 07/25/2013 2306   GFRAA 53* 04/20/2015 0357   GFRAA 45* 07/25/2013 2306   4/30 MRI/MRA Head IMPRESSION: Acute infarction affecting 70% of the left cerebral hemisphere, only  the anterior cerebral artery territory being spared. There is complete occlusion of the left internal carotid artery, the left middle cerebral artery in the left posterior cerebral artery which took a complete fetal origin from the anterior circulation. Flow through a patent anterior communicating artery results an supply of the left anterior cerebral artery territory. Right anterior circulation is intact. Posterior circulation is intact, but is diminutive because of bilateral fetal origin of the posterior cerebral arteries.  Developing swelling of the region of infarction but no midline shift at this moment. That is likely to develop same however. No sign of hemorrhagic transformation.  Small subacute infarction in the right frontal white matter.   4/30 CT Head IMPRESSION: Extensive evidence of acute infarct involving essentially all of the left occipital and temporal lobes as well as the posterior left lentiform nucleus and portions of the superior left cerebellum. This finding is consistent with an acute infarct involving most if not all of the left posterior cerebral artery distribution. No hemorrhage or well-defined mass. No midline shift.  These results were called by telephone at the time of interpretation on 04/14/2015 at 3:03 pm to Dr. Davonna Belling , who verbally acknowledged these results.  4/30 CXR IMPRESSION: No edema or consolidation. Heart borderline prominent.   Total Time: 60 minutes Greater than 50%  of this time was spent counseling and coordinating care related to the above assessment and plan.  Doran Clay D.O. Palliative  Medicine Team at [facility]  Pager: [phone] Team Phone: [phone]

## 2015-04-20 DEATH — deceased

## 2015-04-21 ENCOUNTER — Inpatient Hospital Stay (HOSPITAL_COMMUNITY): Payer: Medicare Other

## 2015-04-21 DIAGNOSIS — I639 Cerebral infarction, unspecified: Secondary | ICD-10-CM

## 2015-04-21 DIAGNOSIS — K117 Disturbances of salivary secretion: Secondary | ICD-10-CM

## 2015-04-21 DIAGNOSIS — I63312 Cerebral infarction due to thrombosis of left middle cerebral artery: Secondary | ICD-10-CM

## 2015-04-21 DIAGNOSIS — R0600 Dyspnea, unspecified: Secondary | ICD-10-CM

## 2015-04-21 LAB — SODIUM
SODIUM: 139 mmol/L (ref 135–145)
Sodium: 143 mmol/L (ref 135–145)

## 2015-04-21 LAB — HEMOGLOBIN A1C
Hgb A1c MFr Bld: 6.1 % — ABNORMAL HIGH (ref 4.8–5.6)
Mean Plasma Glucose: 128 mg/dL

## 2015-04-21 LAB — GLUCOSE, CAPILLARY
Glucose-Capillary: 132 mg/dL — ABNORMAL HIGH (ref 70–99)
Glucose-Capillary: 115 mg/dL — ABNORMAL HIGH (ref 70–99)

## 2015-04-21 MED ORDER — ATROPINE SULFATE 1 % OP SOLN
4.0000 [drp] | OPHTHALMIC | Status: DC | PRN
Start: 2015-04-21 — End: 2015-04-22
  Administered 2015-04-21: 4 [drp] via SUBLINGUAL
  Filled 2015-04-21: qty 5

## 2015-04-21 MED ORDER — MORPHINE SULFATE 25 MG/ML IV SOLN
1.0000 mg/h | INTRAVENOUS | Status: DC
  Administered 2015-04-21: 2 mg/h via INTRAVENOUS
  Administered 2015-04-21: 6 mg/h via INTRAVENOUS
  Filled 2015-04-21: qty 10

## 2015-04-21 MED ORDER — MORPHINE SULFATE 4 MG/ML IJ SOLN: 4.0000 mg | INTRAMUSCULAR | Status: DC | PRN

## 2015-04-21 MED ORDER — LORAZEPAM 2 MG/ML IJ SOLN
1.0000 mg | INTRAMUSCULAR | Status: DC | PRN
  Administered 2015-04-21: 1 mg via INTRAVENOUS
  Filled 2015-04-21: qty 1

## 2015-04-23 DIAGNOSIS — I482 Chronic atrial fibrillation, unspecified: Secondary | ICD-10-CM | POA: Insufficient documentation

## 2015-05-21 NOTE — Progress Notes (Signed)
Silvio ClaymanJohn Harrington of [employer] [facility] just picked up the body accompanied by Security.

## 2015-05-21 NOTE — Progress Notes (Addendum)
STROKE TEAM PROGRESS NOTE   HISTORY 81 y.o. female history of hypertension, hypothyroidism and recently diagnosed atrial fibrillation presenting with acute aphasia and right hemiplegia. She was last seen well at 10:00 last night 04/18/2015. She has no previous history of stroke nor TIA. His been taking aspirin daily. CT scan of her head showed large PCA territory cerebral infarction. NIH stroke score NIH stroke score was 24. Patient became nauseated and vomited a large amount of appearing material. Aspiration was also suspected. As per family new onset A-fib diagnosed about 2 weeks ago. Was not on anticoagulation. Patient was not administered TPA secondary to delay in arrival. She was admitted to the neuro ICU for further evaluation and treatment.   SUBJECTIVE (INTERVAL HISTORY) Her youngest son and family is at the bedside.  Overall he feels her condition is rapidly worsening. States family is leaning toward comfort care.    OBJECTIVE Temp:  [98.3 F (36.8 C)-98.8 F (37.1 C)] 98.8 F (37.1 C) (05/02 0754) Pulse Rate:  [51-84] 72 (05/02 0800) Cardiac Rhythm:  [-] Atrial fibrillation (05/02 0800) Resp:  [11-30] 30 (05/02 0600) BP: (124-155)/(54-82) 124/66 mmHg (05/02 0800) SpO2:  [97 %-100 %] 97 % (05/02 0800)   Recent Labs Lab 04/20/15 1725 04/20/15 1924 04/20/15 2310 05/16/2015 0308 05/07/2015 0724  GLUCAP 112* 110* 123* 132* 115*    Recent Labs Lab May 10, 2015 1410 May 10, 2015 1422 04/20/15 0357 04/20/15 1842 04/20/15 2355 05/14/2015 0603  NA 135 136 136 137 139 143  K 4.1 4.3 4.1  --   --   --   CL 100 102 103  --   --   --   CO2 21  --  25  --   --   --   GLUCOSE 152* 153* 140*  --   --   --   BUN 28* 36* 26*  --   --   --   CREATININE 1.36* 1.20* 1.11*  --   --   --   CALCIUM 9.6  --  9.1  --   --   --     Recent Labs Lab 05/10/2015 1410 04/20/15 0357  AST 32 34  ALT 34 29  ALKPHOS 74 65  BILITOT 0.8 0.7  PROT 7.0 6.2*  ALBUMIN 3.8 3.0*    Recent Labs Lab  May 10, 2015 1410 05/10/15 1422 04/20/15 0357  WBC 13.2*  --  13.4*  NEUTROABS 11.5*  --   --   HGB 14.2 15.6* 12.8  HCT 43.3 46.0 38.9  MCV 88.2  --  88.2  PLT 204  --  227   No results for input(s): CKTOTAL, CKMB, CKMBINDEX, TROPONINI in the last 168 hours.  Recent Labs  05/10/15 1410  LABPROT 13.8  INR 1.05    Recent Labs  2015/05/10 1330  COLORURINE YELLOW  LABSPEC 1.020  PHURINE 5.0  GLUCOSEU NEGATIVE  HGBUR NEGATIVE  BILIRUBINUR NEGATIVE  KETONESUR NEGATIVE  PROTEINUR NEGATIVE  UROBILINOGEN 0.2  NITRITE NEGATIVE  LEUKOCYTESUR NEGATIVE       Component Value Date/Time   CHOL 190 04/20/2015 0357   TRIG 151* 04/20/2015 0357   HDL 44 04/20/2015 0357   CHOLHDL 4.3 04/20/2015 0357   VLDL 30 04/20/2015 0357   LDLCALC 116* 04/20/2015 0357   No results found for: HGBA1C    Component Value Date/Time   LABOPIA NONE DETECTED 2015/05/10 1330   COCAINSCRNUR NONE DETECTED 05/10/2015 1330   LABBENZ POSITIVE* 05-10-2015 1330   AMPHETMU NONE DETECTED 05/10/15 1330   THCU NONE  DETECTED 14-May-2015 1330   LABBARB NONE DETECTED May 14, 2015 1330     Recent Labs Lab May 14, 2015 1413  ETH <5    Ct Head Wo Contrast  04/27/2015   CLINICAL DATA:  Left MCA infarct. Cerebral edema. Persistent decline scratch the continued decline and mental status.  EXAM: CT HEAD WITHOUT CONTRAST  TECHNIQUE: Contiguous axial images were obtained from the base of the skull through the vertex without intravenous contrast.  COMPARISON:  CT head without contrast 04/20/2015. MRI brain 05/14/15.  FINDINGS: A large left MCA and PCA territory infarct is again seen. There is marked hypoattenuation without evidence for parenchymal hemorrhage. Hyperdensity a along the falx is similar to the prior studies and may be related to the adjacent edema. Minimal blood is considered less likely.  There is continued progression of edema within the left hemisphere. Midline shift just above the foramen of Monro now measures 15  mm compared with 10 mm on the prior exam. There is progressive dilation of the right lateral ventricle, now measuring up to 16 mm across the atrium of the lateral ventricle compared with 13 mm previous. Mild dilation of the temporal tip is evident. Uncal herniation is noted.  No new areas of infarction are evident.  A polyp or mucous retention scratch the polyps or mucous retention cysts are noted within the maxillary sinuses bilaterally. Scattered mucosal thickening is evident within the ethmoid air cells. The mastoid air cells are clear. The cranium is intact.  IMPRESSION: 1. Progressive edema associated with the left MCA and PCA territory infarcts. 2. Increasing mass effect and midline shift with progressive uncal herniation.   Electronically Signed   By: Marin Roberts M.D.   On: 04/25/2015 07:39   Ct Head Wo Contrast  04/20/2015   CLINICAL DATA:  Initial evaluation for unequal and fixed pupils.  EXAM: CT HEAD WITHOUT CONTRAST  TECHNIQUE: Contiguous axial images were obtained from the base of the skull through the vertex without intravenous contrast.  COMPARISON:  Prior MRI and CT from 05/14/2015.  FINDINGS: Extensive cytotoxic edema again seen throughout the left MCA territory involving the left frontal, parietal, occipital, and temporal lobes, compatible with acute left MCA territory infarct. There is involvement of the left basal ganglia. Left ACA territory is spared. Overall, distribution is similar relative to prior MRI. No evidence for hemorrhagic transformation. There is associated mass effect with edema with approximately 11 mm of left-to-right midline shift at the level of the septum pellucidum. This measured approximately 5 mm on previous MRI from 14-May-2015. There is mild left uncal herniation with partial effacement of the basilar cisterns. Foramen magnum remains patent.  No new large vessel intracranial infarct identified.  No mass lesion. No hydrocephalus. Left lateral ventricle largely  effaced. No extra-axial fluid collection.  Calvarium remains intact. No acute abnormality about the orbits. Right parietal scalp contusion noted, similar to previous.  Retention cyst present within the right maxillary sinus. Mild mucosal thickening present within the ethmoidal air cells and sphenoid sinuses. Paranasal sinuses are otherwise clear. No mastoid effusion.  IMPRESSION: 1. Evolving large volume acute left MCA territory infarct with extensive cytotoxic edema throughout the left cerebral hemisphere. There is associated 11 mm of left-to-right midline shift at the level of the septum pellucidum (previously 5 mm on MRI from 05-14-15), with partial effacement of the basilar cisterns. No evidence for hemorrhagic transformation. 2. No other new intracranial process identified. These results were discussed at the time of interpretation on 04/20/2015 at 6:36 pm to Dr. Leonette Most  STEWART by the radiologist Dr. Genevive Bi.   Electronically Signed   By: Rise Mu M.D.   On: 04/20/2015 18:42   Ct Head Wo Contrast  04-24-15   CLINICAL DATA:  Extensive right-sided  paresis/weakness  EXAM: CT HEAD WITHOUT CONTRAST  TECHNIQUE: Contiguous axial images were obtained from the base of the skull through the vertex without intravenous contrast.  COMPARISON:  None.  FINDINGS: The ventricles are normal in size and configuration. There is extensive cytotoxic edema throughout the left temporal and occipital lobes. There is also decreased attenuation in the superior left cerebellum compared to the right. There is decreased attenuation throughout the left posterior lentiform nucleus as well as involving the posterior limb of the left external capsule. Left thalamus as well as the anterior left basal ganglia regions do not appear involved. There is sulcal effacement in these areas of cytotoxic edema. There is no hemorrhage, well-defined mass, or midline shift. There is no subdural or epidural fluid collection.  The  bony calvarium appears intact. The mastoid air cells are clear. There is mucosal thickening in both maxillary antra with retention cysts in both maxillary antra, more on the right than on the left.  IMPRESSION: Extensive evidence of acute infarct involving essentially all of the left occipital and temporal lobes as well as the posterior left lentiform nucleus and portions of the superior left cerebellum. This finding is consistent with an acute infarct involving most if not all of the left posterior cerebral artery distribution. No hemorrhage or well-defined mass. No midline shift.  These results were called by telephone at the time of interpretation on 2015/04/24 at 3:03 pm to Dr. Benjiman Core , who verbally acknowledged these results.   Electronically Signed   By: Bretta Bang III M.D.   On: 04-24-15 15:03   Mr Maxine Glenn Head Wo Contrast  04/24/2015   CLINICAL DATA:  Right-sided weakness and aphasia.  Abnormal head CT.  EXAM: MRI HEAD WITHOUT CONTRAST  MRA HEAD WITHOUT CONTRAST  TECHNIQUE: Multiplanar, multiecho pulse sequences of the brain and surrounding structures were obtained without intravenous contrast. Angiographic images of the head were obtained using MRA technique without contrast.  COMPARISON:  Head CT ear earlier same day  FINDINGS: MRI HEAD FINDINGS  The brainstem, and cerebellum are normal. Right cerebral hemisphere shows a 7 x 12 mm subacute infarction in the right frontal subcortical white matter. The left hemisphere shows acute infarction of the entire left temporal lobe, occipital lobe and partially affecting the left thalamus, lentiform nucleus, opercular region in the frontal lobe and parietal lobe. This involves approximately 70% of the entire left hemisphere. There is mild swelling and early mass effect but no midline shift at this time. No hemorrhagic transformation at this time. No hydrocephalus. No extra-axial collection. No neoplastic mass lesion.  MRA HEAD FINDINGS  The right  internal carotid artery is widely patent into the brain without stenosis. This vessel supplies the right middle cerebral artery territory, the right posterior cerebral artery territory PA fetal origin in the right anterior cerebral artery territory. No stenosis of those vessels. The a patent anterior communicating artery, there is supply of the left anterior cerebral artery with retrograde flow back into the A1 segment. There is complete occlusion of the left internal carotid artery and of the left middle cerebral artery and fetal origin posterior cerebral artery. The vertebral arteries are diminutive but patent to the basilar. No basilar stenosis, though this is a small vessel. The basilar artery supplies only the  superior cerebellar arteries because of the fetal origin of both PCAs.  IMPRESSION: Acute infarction affecting 70% of the left cerebral hemisphere, only the anterior cerebral artery territory being spared. There is complete occlusion of the left internal carotid artery, the left middle cerebral artery in the left posterior cerebral artery which took a complete fetal origin from the anterior circulation. Flow through a patent anterior communicating artery results an supply of the left anterior cerebral artery territory. Right anterior circulation is intact. Posterior circulation is intact, but is diminutive because of bilateral fetal origin of the posterior cerebral arteries.  Developing swelling of the region of infarction but no midline shift at this moment. That is likely to develop same however. No sign of hemorrhagic transformation.  Small subacute infarction in the right frontal white matter.   Electronically Signed   By: Paulina Fusi M.D.   On: 03/28/2015 21:47   Mr Brain Wo Contrast  03/21/2015   CLINICAL DATA:  Right-sided weakness and aphasia.  Abnormal head CT.  EXAM: MRI HEAD WITHOUT CONTRAST  MRA HEAD WITHOUT CONTRAST  TECHNIQUE: Multiplanar, multiecho pulse sequences of the brain and  surrounding structures were obtained without intravenous contrast. Angiographic images of the head were obtained using MRA technique without contrast.  COMPARISON:  Head CT ear earlier same day  FINDINGS: MRI HEAD FINDINGS  The brainstem, and cerebellum are normal. Right cerebral hemisphere shows a 7 x 12 mm subacute infarction in the right frontal subcortical white matter. The left hemisphere shows acute infarction of the entire left temporal lobe, occipital lobe and partially affecting the left thalamus, lentiform nucleus, opercular region in the frontal lobe and parietal lobe. This involves approximately 70% of the entire left hemisphere. There is mild swelling and early mass effect but no midline shift at this time. No hemorrhagic transformation at this time. No hydrocephalus. No extra-axial collection. No neoplastic mass lesion.  MRA HEAD FINDINGS  The right internal carotid artery is widely patent into the brain without stenosis. This vessel supplies the right middle cerebral artery territory, the right posterior cerebral artery territory PA fetal origin in the right anterior cerebral artery territory. No stenosis of those vessels. The a patent anterior communicating artery, there is supply of the left anterior cerebral artery with retrograde flow back into the A1 segment. There is complete occlusion of the left internal carotid artery and of the left middle cerebral artery and fetal origin posterior cerebral artery. The vertebral arteries are diminutive but patent to the basilar. No basilar stenosis, though this is a small vessel. The basilar artery supplies only the superior cerebellar arteries because of the fetal origin of both PCAs.  IMPRESSION: Acute infarction affecting 70% of the left cerebral hemisphere, only the anterior cerebral artery territory being spared. There is complete occlusion of the left internal carotid artery, the left middle cerebral artery in the left posterior cerebral artery which  took a complete fetal origin from the anterior circulation. Flow through a patent anterior communicating artery results an supply of the left anterior cerebral artery territory. Right anterior circulation is intact. Posterior circulation is intact, but is diminutive because of bilateral fetal origin of the posterior cerebral arteries.  Developing swelling of the region of infarction but no midline shift at this moment. That is likely to develop same however. No sign of hemorrhagic transformation.  Small subacute infarction in the right frontal white matter.   Electronically Signed   By: Paulina Fusi M.D.   On: 04/04/2015 21:47   Dg  Chest Port 1 View  04/20/2015   CLINICAL DATA:  Central line placement  EXAM: PORTABLE CHEST - 1 VIEW  COMPARISON:  04/20/2015  FINDINGS: There is a new left jugular central line with tip in the expected location of the SVC just above the cavoatrial junction. There is no pneumothorax. There is unchanged right hemidiaphragm elevation. Hilar, mediastinal and cardiac contours are unremarkable and unchanged.  IMPRESSION: New left jugular central line.  No pneumothorax.   Electronically Signed   By: Ellery Plunk M.D.   On: 04/20/2015 22:50   Dg Chest Port 1 View  04/20/2015   CLINICAL DATA:  Dyspnea  EXAM: PORTABLE CHEST - 1 VIEW  COMPARISON:  18-May-2015  FINDINGS: A single AP portable view of the chest demonstrates no focal airspace consolidation or alveolar edema. The lungs are grossly clear. There is no large effusion or pneumothorax. Cardiac and mediastinal contours appear unremarkable.  IMPRESSION: No active disease.   Electronically Signed   By: Ellery Plunk M.D.   On: 04/20/2015 21:58   Dg Chest Portable 1 View  05-18-15   CLINICAL DATA:  Weakness and hypertension  EXAM: PORTABLE CHEST - 1 VIEW  COMPARISON:  None.  FINDINGS: There is no edema or consolidation. Heart is borderline enlarged with pulmonary vascularity within normal limits. No adenopathy. There is  thoracic levoscoliosis.  IMPRESSION: No edema or consolidation.  Heart borderline prominent.   Electronically Signed   By: Bretta Bang III M.D.   On: 05/18/2015 14:44     PHYSICAL EXAM HEENT- Normocephalic, no lesions, without obvious abnormality. Normal external eye and conjunctiva. Normal TM's bilaterally. Normal auditory canals and external ears. Normal external nose, mucus membranes and septum. Normal pharynx. Neck supple with no masses, nodes, nodules or enlargement. Cardiovascular - irregularly irregular rhythm and S1, S2 normal Lungs - chest clear, no wheezing, rales, normal symmetric air entry Abdomen - soft, non-tender; bowel sounds normal; no masses, no organomegaly Extremities - no joint deformities, effusion, or inflammation Skin -   Neurologic Examination: Mental Status: Patient was lethargic and globally aphasic. He makes guttural sounds and occasionally howls in pain She had gaze deviation to the left side with inattentiveness to the right. Cranial Nerves: II Unable to assess as does not blink to threat on either side III/IV/VI-Pupils were equal and reacted. Eyes were deviated to the left side conjugately with no movement beyond midline toward the right.  VII-moderate right lower facial weakness. VIII-normal. X-no speech output. Motor: No spontaneous extremity movements but will withdraw the left side more than the right side partially to painful stimuli.  Sensory: Unable to adequately assess. Deep Tendon Reflexes: 2+ and left extremities: Unable to elicit reflexes in right extremities due to increased muscle tone. Plantars: Extensor on the right and mute on the left   ASSESSMENT/PLAN Ms. TASHYA ALBERTY is a 81 y.o. female with history of recently diagnosed atrial fibrillation not on anticoagulation,  presenting with acute aphasia and right hemiplegia. She did not receive IV t-PA due to delay in arrival.   Stroke:  Dominant large left PCA infarct embolic  and subacute R frontal infarct, both secondary to known atrial fibrillation with increasing cytotoxic cerebral edema  Resultant  Comatose state, right hemiplegia, periods of apnea, dysphagia (NPO)  MRI   L MCA infarction, spares the ACA. Small right frontal white matter subacute infarction.   MRA  complete left ICA, MCA and PCA occlusions   Repeat CT with increasing cerebral edema  Dr. Pearlean Brownie discussed diagnosis, prognosis,  treatment options and plan of care with son and family at the bedside. They are in agreement patient would not want to live with disability. They have spoken with palliative care team and request comfort care .   Discontinue all treatments and therapies  Start IV heparin drip for comfort  Transfer to floor for palliative/comfort care  Do not anticipate she will survive hopsitalization  Atrial Fibrillation  Home meds:  Aspirin 81  Not on anticoagulation   Hypertension  Home meds:   HCTZ, toprol  Stable  Hyperlipidemia  Home meds:  No statin  LDL 116, goal < 70  Other Stroke Risk Factors  Advanced age  Obesity, Body mass index is 36.5 kg/(m^2).   Other Active Problems  hypothryoid  Aspiration PNA treated with cerefime  Hospital day # 2  Rhoderick MoodyBIBY,SHARON  [facility] See Amion for Pager information 04/30/2015 1:02 PM  I have personally examined this patient, reviewed notes, independently viewed imaging studies, participated in medical decision making and plan of care. I have made any additions or clarifications directly to the above note. Agree with note above. The patient has unfortunately had a massive left hemispheric infarct with 15 mm left to right trans-facine brain herniation with pressure on the brainstem and is unlikely to survive without profound and significant disabled disability. She is at further risk for neurological worsening and death. I had a 15 minute discussion with the patient's family at the bedside about her poor  prognosis and answered questions. Family understands her poor prognosis and are leaning towards palliative care and keeping her comfortable and not doing any further aggressive care. I am in agreement. This patient is critically ill and at significant risk of neurological worsening, death and care requires constant monitoring of vital signs, hemodynamics,respiratory and cardiac monitoring,review of multiple databases, neurological assessment, discussion with family, other specialists and medical decision making of high complexity.I have made any additions or clarifications directly to the above note.  I spent 40 minutes of neurocritical care time  in the care of  this patient.   Delia HeadyPramod Debra Calabretta, MD Medical Director [facility] : [facility] Pager: [phone] 05/03/2015 2:03 PM    To contact Stroke Continuity provider, please refer to [email or web address]. After hours, contact General Neurology

## 2015-05-21 NOTE — Progress Notes (Signed)
OT Cancellation Note  Patient Details Name: Carol FootmanRebecca L Baker MRN: [id number] DOB: Apr 23, 1933   Cancelled Treatment:    Reason Eval/Treat Not Completed: Other (comment) Noted discontinuation due to pt is not comfort care.  [facility] SystemWARD,HILLARY  Caellum Mancil, OTR/L  [phone] 05/17/2015 05/02/2015, 5:07 PM

## 2015-05-21 NOTE — Progress Notes (Addendum)
Noted patient stop breathing, HR not appreciated, no pulse at 1930. Another RN verified death. MD made aware of the death,Emotional support given to the family.

## 2015-05-21 NOTE — Progress Notes (Signed)
PT Cancellation Note  Patient Details Name: Carol FootmanRebecca L Baker MRN: [id number] DOB: 11/07/1933   Cancelled Treatment:    Reason Eval/Treat Not Completed: Other (comment) (Discharge due to move to comfort care.Marland Kitchen.)  04/26/2015  [place] BingKen Erina Hamme, PT [phone]  (pager) Keilen Kahl, Eliseo GumKenneth V 05/16/2015, 4:59 PM

## 2015-05-21 NOTE — Progress Notes (Signed)
Patient ZO:XWRUEAV:Carol Baker      DOB: 28-Oct-1933      WUJ:[id number]   [facility]all[facility] Medicine Team at [facility] Progress Note    Subjective: Minimally responsive, no purposeful interaction   Filed Vitals:   Aug 23, 2015 1137  BP: 148/74  Pulse: 78  Temp: 98.6 F (37 C)  Resp: 16   Physical exam: GEN: agonal breathing, NAD HEENT: [place], mmm CV: regular rate  CBC    Component Value Date/Time   WBC 13.4* 04/20/2015 0357   WBC 8.7 07/25/2013 2306   RBC 4.41 04/20/2015 0357   RBC 4.43 07/25/2013 2306   HGB 12.8 04/20/2015 0357   HGB 13.3 07/25/2013 2306   HCT 38.9 04/20/2015 0357   HCT 38.7 07/25/2013 2306   PLT 227 04/20/2015 0357   PLT 245 07/25/2013 2306   MCV 88.2 04/20/2015 0357   MCV 87 07/25/2013 2306   MCH 29.0 04/20/2015 0357   MCH 30.0 07/25/2013 2306   MCHC 32.9 04/20/2015 0357   MCHC 34.4 07/25/2013 2306   RDW 13.2 04/20/2015 0357   RDW 13.8 07/25/2013 2306   LYMPHSABS 0.9 03/21/2015 1410   MONOABS 0.8 03/25/2015 1410   EOSABS 0.0 04/09/2015 1410   BASOSABS 0.0 03/27/2015 1410    CMP     Component Value Date/Time   NA 143 11-May-2015 0603   NA 139 07/25/2013 2306   K 4.1 04/20/2015 0357   K 4.3 07/25/2013 2306   CL 103 04/20/2015 0357   CL 106 07/25/2013 2306   CO2 25 04/20/2015 0357   CO2 31 07/25/2013 2306   GLUCOSE 140* 04/20/2015 0357   GLUCOSE 108* 07/25/2013 2306   BUN 26* 04/20/2015 0357   BUN 29* 07/25/2013 2306   CREATININE 1.11* 04/20/2015 0357   CREATININE 1.33* 07/25/2013 2306   CALCIUM 9.1 04/20/2015 0357   CALCIUM 10.0 07/25/2013 2306   PROT 6.2* 04/20/2015 0357   ALBUMIN 3.0* 04/20/2015 0357   AST 34 04/20/2015 0357   ALT 29 04/20/2015 0357   ALKPHOS 65 04/20/2015 0357   BILITOT 0.7 04/20/2015 0357   GFRNONAA 46* 04/20/2015 0357   GFRNONAA 38* 07/25/2013 2306   GFRAA 53* 04/20/2015 0357   GFRAA 45* 07/25/2013 2306    5/2 CT Head IMPRESSION: 1. Progressive edema associated with the left MCA and PCA territory infarcts. 2.  Increasing mass effect and midline shift with progressive uncal herniation.   Assessment and plan: 81 yo female with PMHx of Afib, HTN who presented with large left sided ischemic stroke  1. Code Status: DNR documented  2. GOC: Cerebral edema and herniation noted on CT.  Neurology discussed with family and now plan for comfort. She is on morphine drip with agonal breathing. Suspect prognosis is hours to days and likely in hospital death. Spoke with family today to confirm plan and offer support.  Answered some questions about what to expect going forward and prognosis.  I gave them my contact information should additional questions or concerns arise.  3. Symptom Management:  1. Pain/Dyspnea- on morphine drip at 6mg /hr.  I will add bolus dose of 4mg .  Remember, acute symptoms should not be treated by adjusting infusion rates.  Bolus dosing should be used to treat acute symptoms and drip titrated based on frequency of bolus dosing.  If increasing drip rate should also increase bolus.  Generally bolus dose should be between 50-150% of continuous infusion rate.  Also added low dose ativan PRN.  2. Terminal Secretions- I will add atropine drops  4. Psychosocial/Spiritual: 2 sons. Lives in [place]. Enjoyed cooking. Less mobile/active recently.   Total Time: 25 minutes >50% of time spent in counseling and coordination of care regarding above plan.   Orvis Brill D.O. Palliative Medicine Team at [facility]  Pager: [phone] Team Phone: [phone]

## 2015-05-21 NOTE — Progress Notes (Signed)
UR completed.  Palliative Care has already met with family. Pt in ICU, critical. Will cont to follow for any d/c planning needs. Anticipate SNF vs Hospice level of care.  Sandi Mariscal, RN BSN [place] CCM Trauma/Neuro ICU Case Manager [phone]

## 2015-05-21 NOTE — Progress Notes (Signed)
Pt became more lethargic, not opening eyes to voice or pain. Pt still withdraws to pain. On call notified with no new orders at this time. Will continue to monitor. Herma ArdMesser, Yolanda Huffstetler RN BSN.

## 2015-05-21 DEATH — deceased

## 2015-05-31 NOTE — Discharge Summary (Signed)
Patient ID: Carol Baker MRN: [id number] DOB/AGE: [birth year] 81 y.o.  Admit date: May 06, 2015 Death date: 2016-05-19at 1497  Admission Diagnoses: stroke  Cause of Death:  Respiratory failure due to large  Embolic left PCA infarct and subacute R frontal infarct, both secondary to known atrial fibrillation with increasing cytotoxic cerebral edema patient made DO NOT RESUSCITATE and comfort care by family  Pertinent Medical Diagnosis: Active Problems:   Stroke   HTN (hypertension)   Chronic a-fib   Hypothyroidism   Palliative care encounter   Dysphagia, pharyngoesophageal phase   CVA (cerebral infarction)   Dyspnea   Increased oropharyngeal secretions   Chronic atrial fibrillation   Hospital Course:  81 y.o. female history of hypertension, hypothyroidism and recently diagnosed atrial fibrillation presenting with acute aphasia and right hemiplegia. She was last seen well at 10:00   night  Of 04/18/2015. She has no previous history of stroke nor TIA. His been taking aspirin daily. CT scan of her head showed large PCA territory cerebral infarction. NIH stroke score NIH stroke score was 24. Patient became nauseated and vomited a large amount of appearing material. Aspiration was also suspected. As per family new onset A-fib diagnosed about 2 weeks ago. Was not on anticoagulation. Patient was not administered TPA secondary to delay in arrival. She was admitted to the neuro ICU for further evaluation and treatment. Her neurological condition remained poor throughout hospitalization with patient being lethargic, globally aphasic, having dense right hemiplegia, right-sided neglect. Left gaze deviation. She was unable to swallow or speak. MRI scan of the brain showed large left hemispheric infarct involving 70% of the left hemisphere. Subsequent follow-up CT scan showed increasing cytotoxic edema. And 15 mm left-to-right midline shift and trans-falx Herniation with compression on the brainstem. It was  clear that patient wasn't unlikely to survive have profound and significant lifelong disability. After this was explained to the patient's family members they agreed to DO NOT RESUSCITATE and made decision to move to full palliative care and comfort care at the next day. Her condition gradually declined and she was found to be not breathing and pulseless and pronounced dead by the RN on 05-08-15 at 1930 hrs. CT of the brain Extensive evidence of acute infarct involving essentially all of the left occipital and temporal lobes as well as the posterior left lentiform nucleus and portions of the superior left cerebellum MRI of the brain Acute infarction affecting 70% of the left cerebral hemisphere, only the anterior cerebral artery territory being spared  Signed: SETHI,PRAMOD 05/31/2015, 5:27 PM

## 2015-06-16 IMAGING — CT CT HEAD W/O CM
1 of 2 series · 15 of 30 positions shown, 19 images · non-contrast
Comparison: CT head without contrast 04/20/2015. MRI brain
04/19/2015.

CLINICAL DATA: Left MCA infarct. Cerebral edema. Persistent decline
scratch the continued decline and mental status.

EXAM:
CT HEAD WITHOUT CONTRAST
TECHNIQUE: Contiguous axial images were obtained from the base of the skull
through the vertex without intravenous contrast.

[Series 3: head 2.0 h70h · axial · 0.45mm/px · z∈[-65,+77]mm · 15 of 79 slices shown, 19 images]
[im 4/79  brain]
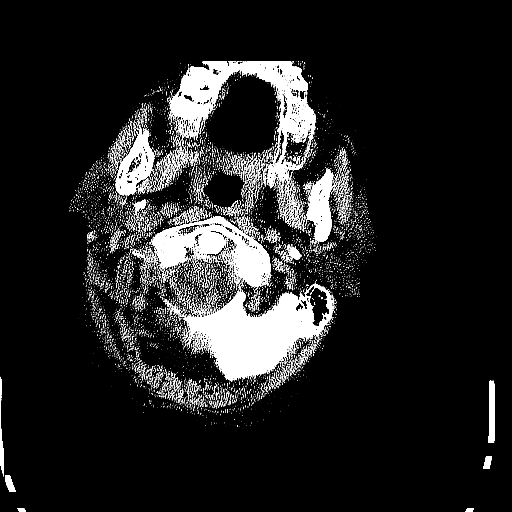
[im 4/79  bone]
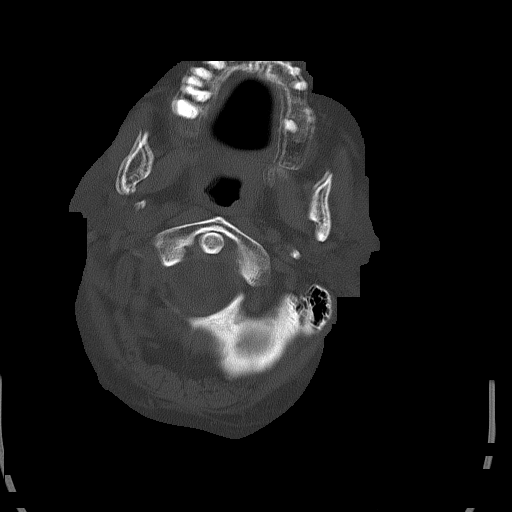
[im 8/79  brain]
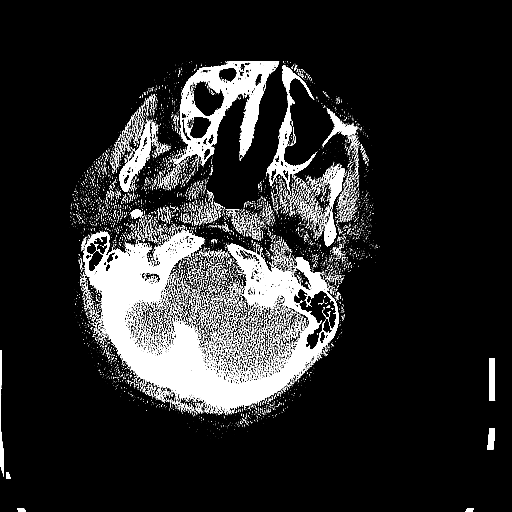
[im 16/79  brain]
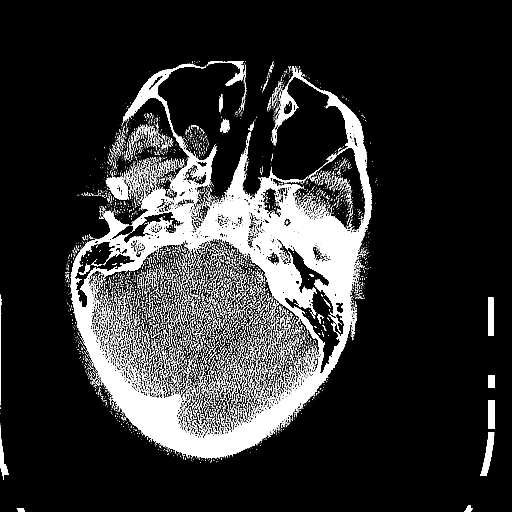
[im 20/79  brain]
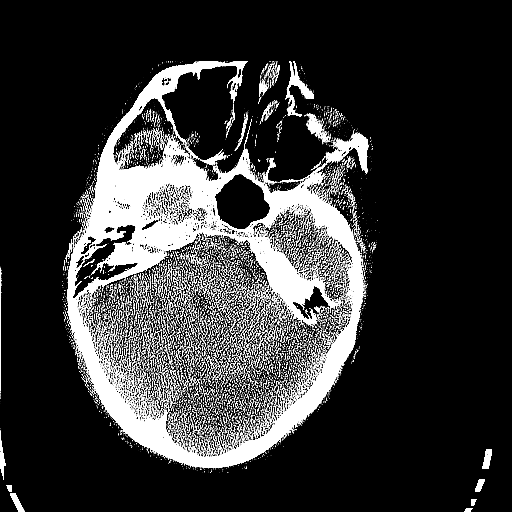
[im 24/79  brain]
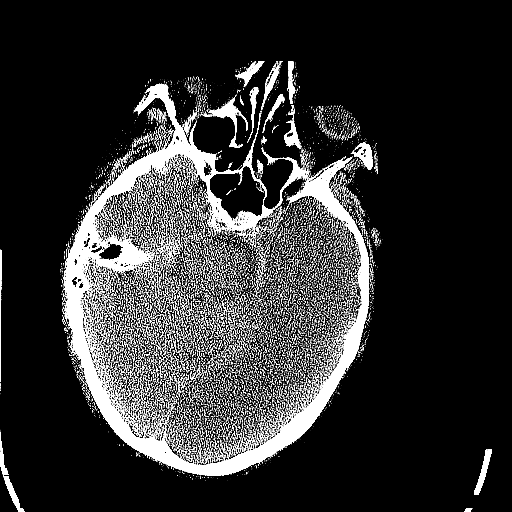
[im 24/79  bone]
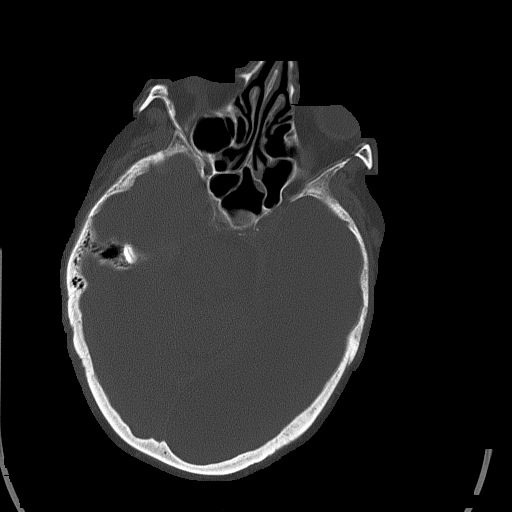
[im 28/79  brain]
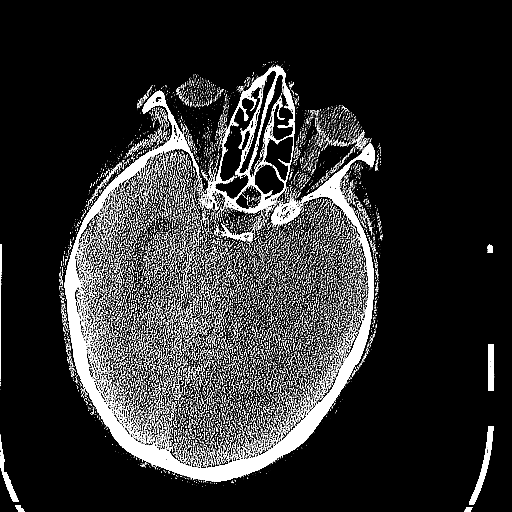
[im 36/79  brain]
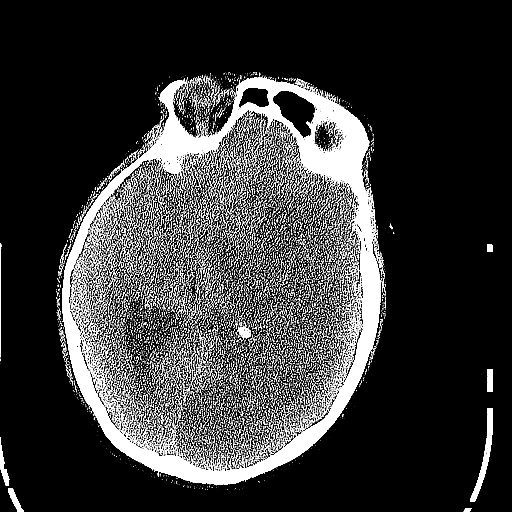
[im 40/79  brain]
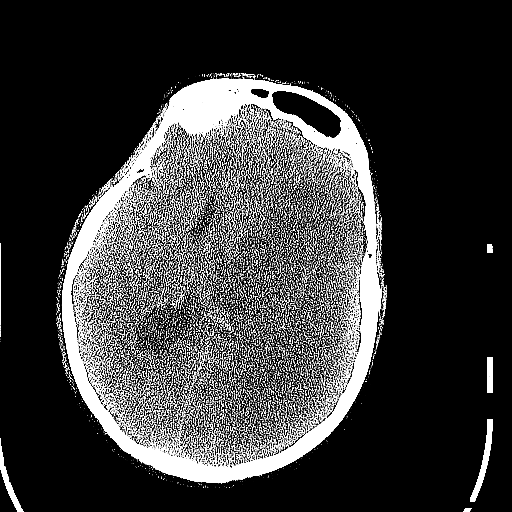
[im 43/79  brain]
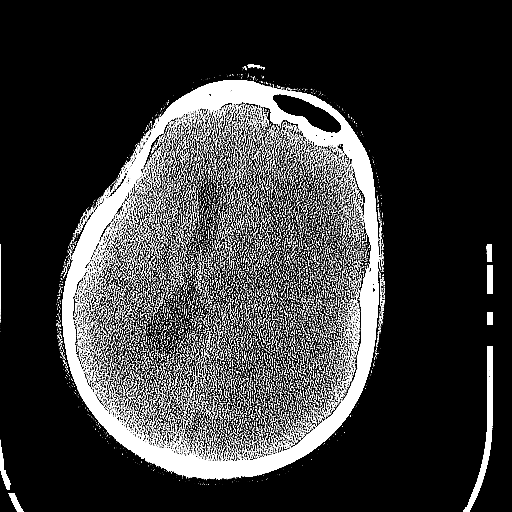
[im 43/79  bone]
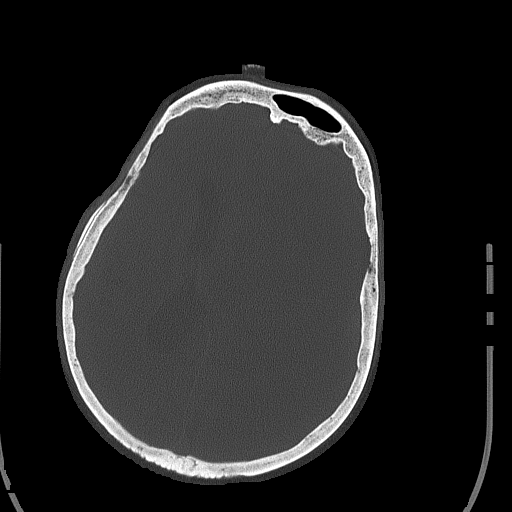
[im 51/79  brain]
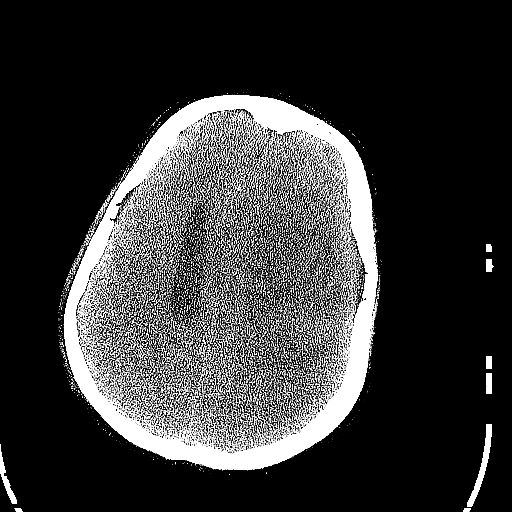
[im 55/79  brain]
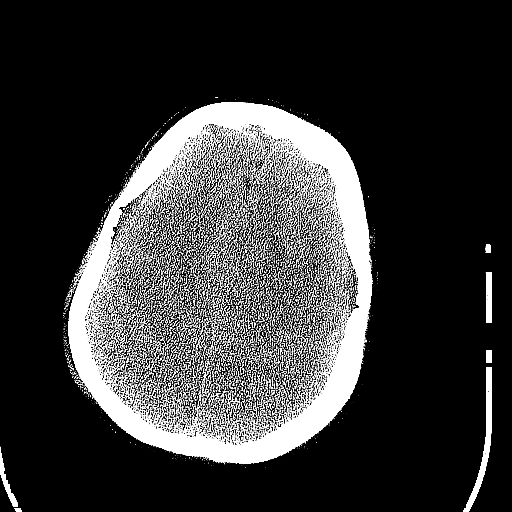
[im 59/79  brain]
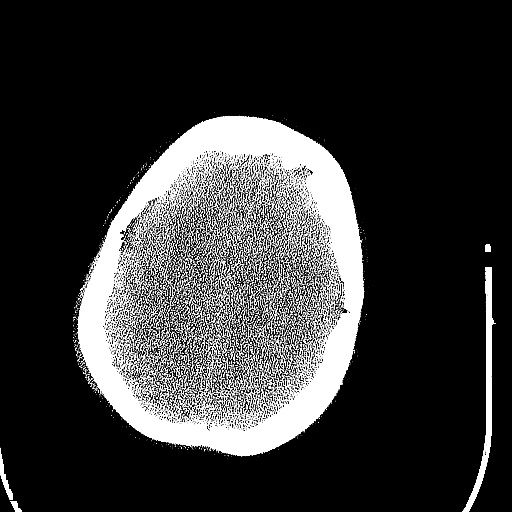
[im 63/79  brain]
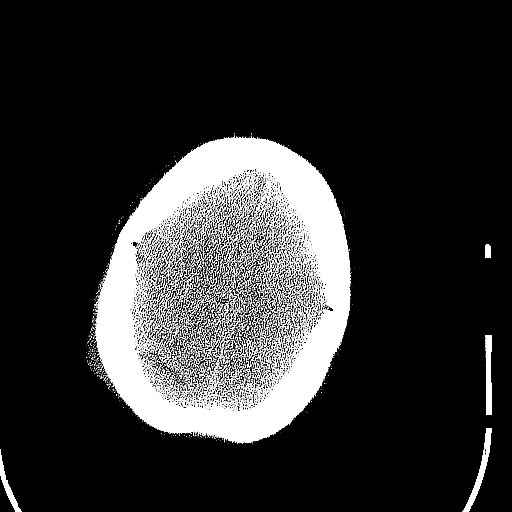
[im 63/79  bone]
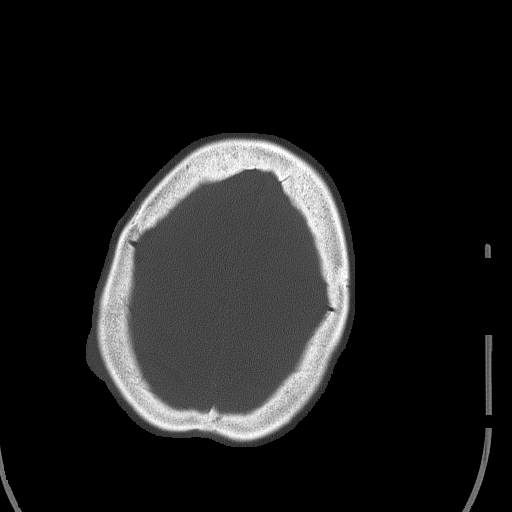
[im 71/79  brain]
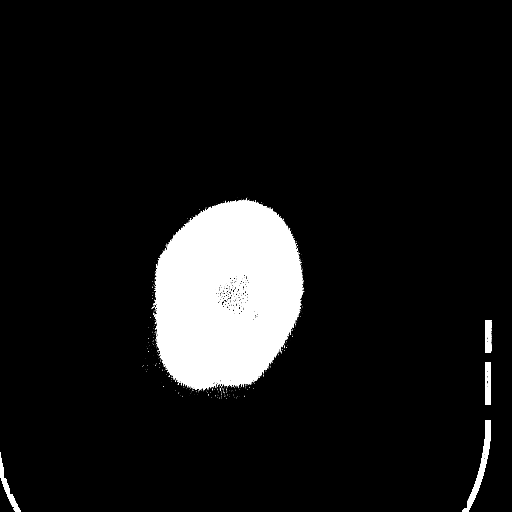
[im 75/79  brain]
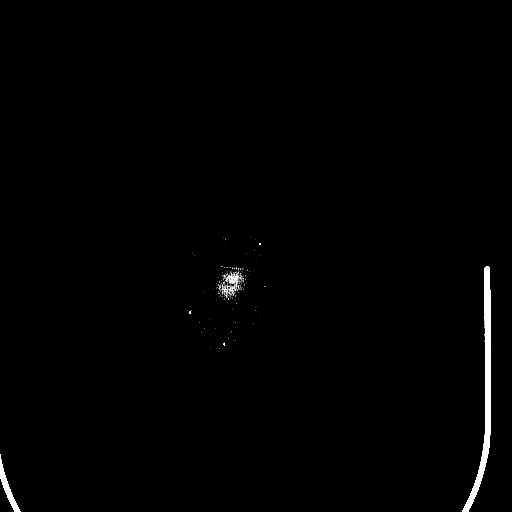

[15 of 30 positions shown; findings below may reference images not displayed]

FINDINGS: A large left MCA and PCA territory infarct is again seen. There is
marked hypoattenuation without evidence for parenchymal hemorrhage.
Hyperdensity a along the falx is similar to the prior studies and
may be related to the adjacent edema. Minimal blood is considered
less likely.

There is continued progression of edema within the left hemisphere.
Midline shift just above the foramen of Cobian now measures 15 mm
compared with 10 mm on the prior exam. There is progressive dilation
of the right lateral ventricle, now measuring up to 16 mm across the
atrium of the lateral ventricle compared with 13 mm previous. Mild
dilation of the temporal tip is evident. Uncal herniation is noted.

No new areas of infarction are evident.

A polyp or mucous retention scratch the polyps or mucous retention
cysts are noted within the maxillary sinuses bilaterally. Scattered
mucosal thickening is evident within the ethmoid air cells. The
mastoid air cells are clear. The cranium is intact.
IMPRESSION: 1. Progressive edema associated with the left MCA and PCA territory
infarcts.
2. Increasing mass effect and midline shift with progressive uncal
herniation.
